# Patient Record
Sex: Female | Born: 1995 | Hispanic: Yes | Marital: Single | Smoking: Former smoker
Health system: Southern US, Community
[De-identification: ages and names within clinical notes are randomized; demographics above are authoritative.]

## PROBLEM LIST (undated history)

## (undated) DIAGNOSIS — B9689 Other specified bacterial agents as the cause of diseases classified elsewhere: Secondary | ICD-10-CM

## (undated) DIAGNOSIS — N760 Acute vaginitis: Secondary | ICD-10-CM

## (undated) DIAGNOSIS — A749 Chlamydial infection, unspecified: Principal | ICD-10-CM

## (undated) DIAGNOSIS — N898 Other specified noninflammatory disorders of vagina: Secondary | ICD-10-CM

## (undated) DIAGNOSIS — A549 Gonococcal infection, unspecified: Secondary | ICD-10-CM

## (undated) DIAGNOSIS — Z309 Encounter for contraceptive management, unspecified: Secondary | ICD-10-CM

## (undated) HISTORY — DX: Other specified bacterial agents as the cause of diseases classified elsewhere: B96.89

## (undated) HISTORY — DX: Other specified noninflammatory disorders of vagina: N89.8

## (undated) HISTORY — DX: Chlamydial infection, unspecified: A74.9

## (undated) HISTORY — DX: Gonococcal infection, unspecified: A54.9

## (undated) HISTORY — DX: Encounter for contraceptive management, unspecified: Z30.9

## (undated) HISTORY — DX: Acute vaginitis: N76.0

---
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
## 2014-07-28 ENCOUNTER — Encounter: Payer: Self-pay | Admitting: Adult Health

## 2014-07-28 ENCOUNTER — Ambulatory Visit (INDEPENDENT_AMBULATORY_CARE_PROVIDER_SITE_OTHER): Payer: Medicaid Other | Admitting: Adult Health

## 2014-07-28 VITALS — BP 88/52 | Ht 63.0 in | Wt 144.5 lb

## 2014-07-28 DIAGNOSIS — Z309 Encounter for contraceptive management, unspecified: Secondary | ICD-10-CM | POA: Insufficient documentation

## 2014-07-28 DIAGNOSIS — B9689 Other specified bacterial agents as the cause of diseases classified elsewhere: Secondary | ICD-10-CM

## 2014-07-28 DIAGNOSIS — Z30011 Encounter for initial prescription of contraceptive pills: Secondary | ICD-10-CM

## 2014-07-28 DIAGNOSIS — Z3202 Encounter for pregnancy test, result negative: Secondary | ICD-10-CM

## 2014-07-28 DIAGNOSIS — N760 Acute vaginitis: Secondary | ICD-10-CM

## 2014-07-28 DIAGNOSIS — N898 Other specified noninflammatory disorders of vagina: Secondary | ICD-10-CM | POA: Insufficient documentation

## 2014-07-28 DIAGNOSIS — N926 Irregular menstruation, unspecified: Secondary | ICD-10-CM

## 2014-07-28 DIAGNOSIS — A499 Bacterial infection, unspecified: Secondary | ICD-10-CM

## 2014-07-28 HISTORY — DX: Encounter for contraceptive management, unspecified: Z30.9

## 2014-07-28 HISTORY — DX: Other specified noninflammatory disorders of vagina: N89.8

## 2014-07-28 HISTORY — DX: Other specified bacterial agents as the cause of diseases classified elsewhere: B96.89

## 2014-07-28 LAB — POCT WET PREP (WET MOUNT): WBC WET PREP: POSITIVE

## 2014-07-28 LAB — COMPREHENSIVE METABOLIC PANEL
ALK PHOS: 74 U/L (ref 39–117)
AST: 11 U/L (ref 0–37)
Albumin: 4.2 g/dL (ref 3.5–5.2)
BILIRUBIN TOTAL: 0.3 mg/dL (ref 0.2–1.1)
BUN: 12 mg/dL (ref 6–23)
CO2: 26 meq/L (ref 19–32)
CREATININE: 0.7 mg/dL (ref 0.50–1.10)
Calcium: 8.8 mg/dL (ref 8.4–10.5)
Chloride: 103 mEq/L (ref 96–112)
GLUCOSE: 82 mg/dL (ref 70–99)
Potassium: 4.3 mEq/L (ref 3.5–5.3)
Sodium: 137 mEq/L (ref 135–145)
Total Protein: 7.1 g/dL (ref 6.0–8.3)

## 2014-07-28 LAB — CBC
HEMATOCRIT: 36.3 % (ref 36.0–46.0)
Hemoglobin: 12.3 g/dL (ref 12.0–15.0)
MCH: 25.7 pg — ABNORMAL LOW (ref 26.0–34.0)
MCHC: 33.9 g/dL (ref 30.0–36.0)
MCV: 75.9 fL — ABNORMAL LOW (ref 78.0–100.0)
MPV: 10.1 fL (ref 9.4–12.4)
Platelets: 265 10*3/uL (ref 150–400)
RBC: 4.78 MIL/uL (ref 3.87–5.11)
RDW: 15.2 % (ref 11.5–15.5)
WBC: 6.3 10*3/uL (ref 4.0–10.5)

## 2014-07-28 LAB — POCT URINE PREGNANCY: PREG TEST UR: NEGATIVE

## 2014-07-28 MED ORDER — METRONIDAZOLE 500 MG PO TABS: 500.0000 mg | ORAL_TABLET | Freq: Two times a day (BID) | ORAL | Status: DC

## 2014-07-28 MED ORDER — NORETHIN ACE-ETH ESTRAD-FE 1-20 MG-MCG(24) PO CHEW: 1.0000 | CHEWABLE_TABLET | Freq: Every day | ORAL | Status: DC

## 2014-07-28 NOTE — Progress Notes (Signed)
Subjective:     Patient ID: Caitlyn Chen, female   DOB: 13-Feb-1996, 18 y.o.   MRN: [id number]  HPI Caitlyn Chen is a 18 year old white female in complaining of irregular periods, sometimes 2 a month and then may skip.She started in 6th grade about age [age].Uses pads, last sex months ago, does use condoms.She has lost some weight and feels tired at times. She is a new pt to this practice.  Review of Systems See HPI Reviewed past medical,surgical, social and family history. Reviewed medications and allergies.     Objective:   Physical Exam BP 88/52 mmHg  Ht 5\' 3"  (1.6 m)  Wt 144 lb 8 oz (65.545 kg)  BMI 25.60 kg/m2  LMP 07/13/2014   UPT negative, Skin warm and dry.Pelvic: external genitalia is normal in appearance, vagina: white frothy discharge with odor, cervix:smooth, uterus: normal size, shape and contour, non tender, no masses felt, adnexa: no masses or tenderness noted. Wet prep: + for clue cells and +WBCs. GC/CHL obtained.   Assessment:     Irregular periods Vaginal discharge BV  Contraceptive management    Plan:    Rx flagyl 500 mg 1 bid x 7 days, no alcohol, review handout on BV   Review handout on OC use Rx Minastrin disp 1 pack take 1 daily with 11 refills, start with next period, use condoms Check CBC,CMP,TSH and GC.CHL Return in 3 months for ROS

## 2014-07-28 NOTE — Patient Instructions (Addendum)
Oral Contraception Use Oral contraceptive pills (OCPs) are medicines taken to prevent pregnancy. OCPs work by preventing the ovaries from releasing eggs. The hormones in OCPs also cause the cervical mucus to thicken, preventing the sperm from entering the uterus. The hormones also cause the uterine lining to become thin, not allowing a fertilized egg to attach to the inside of the uterus. OCPs are highly effective when taken exactly as prescribed. However, OCPs do not prevent sexually transmitted diseases (STDs). Safe sex practices, such as using condoms along with an OCP, can help prevent STDs. Before taking OCPs, you may have a physical exam and Pap test. Your health care provider may also order blood tests if necessary. Your health care provider will make sure you are a good candidate for oral contraception. Discuss with your health care provider the possible side effects of the OCP you may be prescribed. When starting an OCP, it can take 2 to 3 months for the body to adjust to the changes in hormone levels in your body.  HOW TO TAKE ORAL CONTRACEPTIVE PILLS Your health care provider may advise you on how to start taking the first cycle of OCPs. Otherwise, you can:   Start on day 1 of your menstrual period. You will not need any backup contraceptive protection with this start time.   Start on the first Sunday after your menstrual period or the day you get your prescription. In these cases, you will need to use backup contraceptive protection for the first week.   Start the pill at any time of your cycle. If you take the pill within 5 days of the start of your period, you are protected against pregnancy right away. In this case, you will not need a backup form of birth control. If you start at any other time of your menstrual cycle, you will need to use another form of birth control for 7 days. If your OCP is the type called a minipill, it will protect you from pregnancy after taking it for 2 days (48  hours). After you have started taking OCPs:   If you forget to take 1 pill, take it as soon as you remember. Take the next pill at the regular time.   If you miss 2 or more pills, call your health care provider because different pills have different instructions for missed doses. Use backup birth control until your next menstrual period starts.   If you use a 28-day pack that contains inactive pills and you miss 1 of the last 7 pills (pills with no hormones), it will not matter. Throw away the rest of the non-hormone pills and start a new pill pack.  No matter which day you start the OCP, you will always start a new pack on that same day of the week. Have an extra pack of OCPs and a backup contraceptive method available in case you miss some pills or lose your OCP pack.  HOME CARE INSTRUCTIONS   Do not smoke.   Always use a condom to protect against STDs. OCPs do not protect against STDs.   Use a calendar to mark your menstrual period days.   Read the information and directions that came with your OCP. Talk to your health care provider if you have questions.  SEEK MEDICAL CARE IF:   You develop nausea and vomiting.   You have abnormal vaginal discharge or bleeding.   You develop a rash.   You miss your menstrual period.   You are losing   your hair.   You need treatment for mood swings or depression.   You get dizzy when taking the OCP.   You develop acne from taking the OCP.   You become pregnant.  SEEK IMMEDIATE MEDICAL CARE IF:   You develop chest pain.   You develop shortness of breath.   You have an uncontrolled or severe headache.   You develop numbness or slurred speech.   You develop visual problems.   You develop pain, redness, and swelling in the legs.  Document Released: 07/19/2011 Document Revised: 12/14/2013 Document Reviewed: 01/18/2013 [facility] Patient Information 2015 [place], [place]. This information is not intended to replace  advice given to you by your health care provider. Make sure you discuss any questions you have with your health care provider. Bacterial Vaginosis Bacterial vaginosis is a vaginal infection that occurs when the normal balance of bacteria in the vagina is disrupted. It results from an overgrowth of certain bacteria. This is the most common vaginal infection in women of childbearing age. Treatment is important to prevent complications, especially in pregnant women, as it can cause a premature delivery. CAUSES  Bacterial vaginosis is caused by an increase in harmful bacteria that are normally present in smaller amounts in the vagina. Several different kinds of bacteria can cause bacterial vaginosis. However, the reason that the condition develops is not fully understood. RISK FACTORS Certain activities or behaviors can put you at an increased risk of developing bacterial vaginosis, including:  Having a new sex partner or multiple sex partners.  Douching.  Using an intrauterine device (IUD) for contraception. Women do not get bacterial vaginosis from toilet seats, bedding, swimming pools, or contact with objects around them. SIGNS AND SYMPTOMS  Some women with bacterial vaginosis have no signs or symptoms. Common symptoms include:  Grey vaginal discharge.  A fishlike odor with discharge, especially after sexual intercourse.  Itching or burning of the vagina and vulva.  Burning or pain with urination. DIAGNOSIS  Your health care provider will take a medical history and examine the vagina for signs of bacterial vaginosis. A sample of vaginal fluid may be taken. Your health care provider will look at this sample under a microscope to check for bacteria and abnormal cells. A vaginal pH test may also be done.  TREATMENT  Bacterial vaginosis may be treated with antibiotic medicines. These may be given in the form of a pill or a vaginal cream. A second round of antibiotics may be prescribed if the  condition comes back after treatment.  HOME CARE INSTRUCTIONS   Only take over-the-counter or prescription medicines as directed by your health care provider.  If antibiotic medicine was prescribed, take it as directed. Make sure you finish it even if you start to feel better.  Do not have sex until treatment is completed.  Tell all sexual partners that you have a vaginal infection. They should see their health care provider and be treated if they have problems, such as a mild rash or itching.  Practice safe sex by using condoms and only having one sex partner. SEEK MEDICAL CARE IF:   Your symptoms are not improving after 3 days of treatment.  You have increased discharge or pain.  You have a fever. MAKE SURE YOU:   Understand these instructions.  Will watch your condition.  Will get help right away if you are not doing well or get worse. FOR MORE INFORMATION  Centers for Disease Control and Prevention, Division of STD Prevention: [employer] American Sexual  Health Association (ASHA): www.ashastd.org  Document Released: 07/30/2005 Document Revised: 05/20/2013 Document Reviewed: 03/11/2013 [facility] Patient Information 2015 [place], [place]. This information is not intended to replace advice given to you by your health care provider. Make sure you discuss any questions you have with your health care provider. No alcohol start OCs with next period Use condoms Return in 3 months for follow up

## 2014-07-29 ENCOUNTER — Telehealth: Payer: Self-pay | Admitting: Adult Health

## 2014-07-29 LAB — TSH: TSH: 0.619 u[IU]/mL (ref 0.350–4.500)

## 2014-07-29 LAB — GC/CHLAMYDIA PROBE AMP
CT Probe RNA: POSITIVE — AB
GC Probe RNA: POSITIVE — AB

## 2014-07-29 NOTE — Telephone Encounter (Signed)
Left message to call.

## 2014-07-29 NOTE — Telephone Encounter (Signed)
Let message to call 

## 2014-07-30 ENCOUNTER — Telehealth: Payer: Self-pay | Admitting: Adult Health

## 2014-07-30 NOTE — Telephone Encounter (Signed)
Left message to call.

## 2014-08-02 ENCOUNTER — Telehealth: Payer: Self-pay | Admitting: Adult Health

## 2014-08-02 MED ORDER — AZITHROMYCIN 500 MG PO TABS: ORAL_TABLET | ORAL | Status: DC

## 2014-08-02 NOTE — Telephone Encounter (Signed)
Pt aware of +GC and +chlamydia will Rx azithromycin 500 mg #2 2 po now and come in 12/23 at 11:45 for Rocephin and see me, will send [facility] 12/23

## 2014-08-04 ENCOUNTER — Ambulatory Visit: Payer: Medicaid Other | Admitting: Adult Health

## 2014-08-06 ENCOUNTER — Emergency Department (HOSPITAL_COMMUNITY)
Admission: EM | Admit: 2014-08-06 | Discharge: 2014-08-06 | Disposition: A | Discharge status: Home / Self Care | Payer: Medicaid Other | Attending: Emergency Medicine | Admitting: Emergency Medicine

## 2014-08-06 ENCOUNTER — Encounter (HOSPITAL_COMMUNITY): Payer: Self-pay | Admitting: *Deleted

## 2014-08-06 DIAGNOSIS — S0121XA Laceration without foreign body of nose, initial encounter: Secondary | ICD-10-CM | POA: Diagnosis not present

## 2014-08-06 DIAGNOSIS — Z8742 Personal history of other diseases of the female genital tract: Secondary | ICD-10-CM | POA: Insufficient documentation

## 2014-08-06 DIAGNOSIS — Y9389 Activity, other specified: Secondary | ICD-10-CM | POA: Insufficient documentation

## 2014-08-06 DIAGNOSIS — Y9289 Other specified places as the place of occurrence of the external cause: Secondary | ICD-10-CM | POA: Insufficient documentation

## 2014-08-06 DIAGNOSIS — Z792 Long term (current) use of antibiotics: Secondary | ICD-10-CM | POA: Insufficient documentation

## 2014-08-06 DIAGNOSIS — S80211A Abrasion, right knee, initial encounter: Secondary | ICD-10-CM | POA: Diagnosis not present

## 2014-08-06 DIAGNOSIS — Y998 Other external cause status: Secondary | ICD-10-CM | POA: Insufficient documentation

## 2014-08-06 DIAGNOSIS — Z79899 Other long term (current) drug therapy: Secondary | ICD-10-CM | POA: Insufficient documentation

## 2014-08-06 DIAGNOSIS — S0181XA Laceration without foreign body of other part of head, initial encounter: Secondary | ICD-10-CM

## 2014-08-06 MED ORDER — SODIUM BICARBONATE 4 % IV SOLN
5.0000 mL | Freq: Once | INTRAVENOUS | Status: AC
  Administered 2014-08-06: 5 mL via SUBCUTANEOUS
  Filled 2014-08-06: qty 5

## 2014-08-06 MED ORDER — LIDOCAINE HCL (PF) 1 % IJ SOLN
5.0000 mL | Freq: Once | INTRAMUSCULAR | Status: AC
  Administered 2014-08-06: 5 mL
  Filled 2014-08-06: qty 5

## 2014-08-06 MED ORDER — BACITRACIN ZINC 500 UNIT/GM EX OINT
TOPICAL_OINTMENT | CUTANEOUS | Status: AC
  Filled 2014-08-06: qty 0.9

## 2014-08-06 NOTE — ED Notes (Addendum)
Denies LOC, mother states she has had all shots for school done, unknown of last tetanus shot, lac to nose noted

## 2014-08-06 NOTE — Discharge Instructions (Signed)
Facial Laceration  A facial laceration is a cut on the face. These injuries can be painful and cause bleeding. Lacerations usually heal quickly, but they need special care to reduce scarring. DIAGNOSIS  Your health care provider will take a medical history, ask for details about how the injury occurred, and examine the wound to determine how deep the cut is. TREATMENT  Some facial lacerations may not require closure. Others may not be able to be closed because of an increased risk of infection. The risk of infection and the chance for successful closure will depend on various factors, including the amount of time since the injury occurred. The wound may be cleaned to help prevent infection. If closure is appropriate, pain medicines may be given if needed. Your health care provider will use stitches (sutures), wound glue (adhesive), or skin adhesive strips to repair the laceration. These tools bring the skin edges together to allow for faster healing and a better cosmetic outcome. If needed, you may also be given a tetanus shot. HOME CARE INSTRUCTIONS  Only take over-the-counter or prescription medicines as directed by your health care provider.  Follow your health care provider's instructions for wound care. These instructions will vary depending on the technique used for closing the wound. For Sutures:  Keep the wound clean and dry.   If you were given a bandage (dressing), you should change it at least once a day. Also change the dressing if it becomes wet or dirty, or as directed by your health care provider.   Wash the wound with soap and water 2 times a day. Rinse the wound off with water to remove all soap. Pat the wound dry with a clean towel.   After cleaning, apply a thin layer of the antibiotic ointment recommended by your health care provider. This will help prevent infection and keep the dressing from sticking.   You may shower as usual after the first 24 hours. Do not soak the  wound in water until the sutures are removed.   Get your sutures removed as directed by your health care provider. With facial lacerations, sutures should usually be taken out after 4-5 days to avoid stitch marks.   Wait a few days after your sutures are removed before applying any makeup. For Skin Adhesive Strips:  Keep the wound clean and dry.   Do not get the skin adhesive strips wet. You may bathe carefully, using caution to keep the wound dry.   If the wound gets wet, pat it dry with a clean towel.   Skin adhesive strips will fall off on their own. You may trim the strips as the wound heals. Do not remove skin adhesive strips that are still stuck to the wound. They will fall off in time.  For Wound Adhesive:  You may briefly wet your wound in the shower or bath. Do not soak or scrub the wound. Do not swim. Avoid periods of heavy sweating until the skin adhesive has fallen off on its own. After showering or bathing, gently pat the wound dry with a clean towel.   Do not apply liquid medicine, cream medicine, ointment medicine, or makeup to your wound while the skin adhesive is in place. This may loosen the film before your wound is healed.   If a dressing is placed over the wound, be careful not to apply tape directly over the skin adhesive. This may cause the adhesive to be pulled off before the wound is healed.   Avoid   prolonged exposure to sunlight or tanning lamps while the skin adhesive is in place.  The skin adhesive will usually remain in place for 5-10 days, then naturally fall off the skin. Do not pick at the adhesive film.  After Healing: Once the wound has healed, cover the wound with sunscreen during the day for 1 full year. This can help minimize scarring. Exposure to ultraviolet light in the first year will darken the scar. It can take 1-2 years for the scar to lose its redness and to heal completely.  SEEK IMMEDIATE MEDICAL CARE IF:  You have redness, pain, or  swelling around the wound.   You see ayellowish-white fluid (pus) coming from the wound.   You have chills or a fever.  MAKE SURE YOU:  Understand these instructions.  Will watch your condition.  Will get help right away if you are not doing well or get worse. Document Released: 09/06/2004 Document Revised: 05/20/2013 Document Reviewed: 03/12/2013 [employer] Patient Information 2015 [employer]. This information is not intended to replace advice given to you by your health care provider. Make sure you discuss any questions you have with your health care provider.  
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
## 2014-08-10 ENCOUNTER — Telehealth: Payer: Self-pay | Admitting: Adult Health

## 2014-08-10 NOTE — Telephone Encounter (Signed)
Left message to call me, pt no showed for rocephin 12/23 for +GC, will send certified letter now.

## 2014-08-11 ENCOUNTER — Encounter (HOSPITAL_COMMUNITY): Payer: Self-pay | Admitting: Emergency Medicine

## 2014-08-11 ENCOUNTER — Emergency Department (HOSPITAL_COMMUNITY)
Admission: EM | Admit: 2014-08-11 | Discharge: 2014-08-11 | Disposition: A | Discharge status: Home / Self Care | Payer: Medicaid Other | Attending: Emergency Medicine | Admitting: Emergency Medicine

## 2014-08-11 DIAGNOSIS — Z4802 Encounter for removal of sutures: Secondary | ICD-10-CM

## 2014-08-11 DIAGNOSIS — Z79899 Other long term (current) drug therapy: Secondary | ICD-10-CM | POA: Insufficient documentation

## 2014-08-11 DIAGNOSIS — Z8742 Personal history of other diseases of the female genital tract: Secondary | ICD-10-CM | POA: Insufficient documentation

## 2014-08-11 NOTE — Discharge Instructions (Signed)

## 2014-08-11 NOTE — ED Notes (Signed)
Here for suture removal. From nose

## 2014-08-11 NOTE — ED Provider Notes (Signed)
CSN: [id number]     Arrival date & time 08/11/14  1825 History   First MD Initiated Contact with Patient 08/11/14 1930     Chief Complaint  Patient presents with  . Suture / Staple Removal     (Consider location/radiation/quality/duration/timing/severity/associated sxs/prior Treatment) Patient is a 18 y.o. female presenting with suture removal. The history is provided by the patient.  Suture / Staple Removal This is a new problem. The current episode started in the past 7 days. The problem has been gradually improving. Pertinent negatives include no abdominal pain, arthralgias, chest pain, coughing, fever, nausea, neck pain or numbness. Nothing aggravates the symptoms. She has tried nothing for the symptoms. The treatment provided significant relief.    Past Medical History  Diagnosis Date  . Vaginal discharge 07/28/2014  . BV (bacterial vaginosis) 07/28/2014  . Contraceptive management 07/28/2014   History reviewed. No pertinent past surgical history. Family History  Problem Relation Age of Onset  . Stroke Mother     at age 61  . Hypertension Father   . Asthma Brother   . Other Maternal Grandmother     degenerative spine disease  . Asthma Maternal Grandmother   . Obesity Maternal Grandmother   . Hypertension Maternal Grandmother   . Other Maternal Grandfather     open heart surgery  . Hypertension Maternal Grandfather   . Sickle cell trait Sister   . Asthma Brother    History  Substance Use Topics  . Smoking status: Never Smoker   . Smokeless tobacco: Never Used  . Alcohol Use: No   OB History    Gravida Para Term Preterm AB TAB SAB Ectopic Multiple Living   0 0 0 0 0 0 0 0 0 0      Review of Systems  Constitutional: Negative for fever and activity change.       All ROS Neg except as noted in HPI  HENT: Negative.   Eyes: Negative for photophobia and discharge.  Respiratory: Negative for cough, shortness of breath and wheezing.   Cardiovascular: Negative for  chest pain and palpitations.  Gastrointestinal: Negative for nausea, abdominal pain and blood in stool.  Genitourinary: Negative for dysuria, frequency and hematuria.  Musculoskeletal: Negative for back pain, arthralgias and neck pain.  Skin: Negative.   Neurological: Negative for dizziness, seizures, speech difficulty and numbness.  Psychiatric/Behavioral: Negative for hallucinations and confusion.      Allergies  Review of patient's allergies indicates no known allergies.  Home Medications   Prior to Admission medications   Medication Sig Start Date End Date Taking? Authorizing Provider  Norethin Ace-Eth Estrad-FE (MINASTRIN 24 FE) 1-20 MG-MCG(24) CHEW Chew 1 tablet by mouth daily. 07/28/14  Yes Estill Dooms, NP  azithromycin (ZITHROMAX) 500 MG tablet Take 2 po now Patient not taking: Reported on 08/11/2014 08/02/14   Estill Dooms, NP  metroNIDAZOLE (FLAGYL) 500 MG tablet Take 1 tablet (500 mg total) by mouth 2 (two) times daily. Patient not taking: Reported on 08/11/2014 07/28/14   Estill Dooms, NP   BP 105/65 mmHg  Pulse 95  Temp(Src) 98.4 F (36.9 C) (Oral)  Resp 24  Ht 5\' 3"  (1.6 m)  Wt 142 lb (64.411 kg)  BMI 25.16 kg/m2  SpO2 100%  LMP 07/13/2014 Physical Exam  Constitutional: She is oriented to person, place, and time. She appears well-developed and well-nourished.  Non-toxic appearance.  HENT:  Head: Normocephalic.    Right Ear: Tympanic membrane and external ear normal.  Left Ear:  Tympanic membrane and external ear normal.  Eyes: EOM and lids are normal. Pupils are equal, round, and reactive to light.  Neck: Normal range of motion. Neck supple. Carotid bruit is not present.  Cardiovascular: Normal rate, regular rhythm, normal heart sounds, intact distal pulses and normal pulses.   Pulmonary/Chest: Breath sounds normal. No respiratory distress.  Abdominal: Soft. Bowel sounds are normal. There is no tenderness. There is no guarding.    Musculoskeletal: Normal range of motion.  Lymphadenopathy:       Head (right side): No submandibular adenopathy present.       Head (left side): No submandibular adenopathy present.    She has no cervical adenopathy.  Neurological: She is alert and oriented to person, place, and time. She has normal strength. No cranial nerve deficit or sensory deficit.  Skin: Skin is warm and dry.  Psychiatric: She has a normal mood and affect. Her speech is normal.  Nursing note and vitals reviewed.   ED Course  Procedures (including critical care time) Labs Review Labs Reviewed - No data to display  Imaging Review No results found.   EKG Interpretation None      MDM  The sutured wound is healing nicely. There are 2 sutures remaining in the wound area. There is no evidence of any abscess or infection present. Sutures removed by nursing staff. Patient is advised to return if any changes, problems, or concerns.    Final diagnoses:  None    *I have reviewed nursing notes, vital signs, and all appropriate lab and imaging results for this patient.[address], PA-C 08/11/14 1956  Nat Christen, MD 08/12/14 [phone]

## 2014-08-11 NOTE — ED Notes (Signed)
Patient here to have stitches removed from bridge of nose.

## 2014-08-31 ENCOUNTER — Telehealth: Payer: Self-pay | Admitting: Adult Health

## 2014-08-31 NOTE — Telephone Encounter (Signed)
Left message to call.

## 2014-09-02 ENCOUNTER — Ambulatory Visit (INDEPENDENT_AMBULATORY_CARE_PROVIDER_SITE_OTHER): Payer: Medicaid Other | Admitting: Adult Health

## 2014-09-02 ENCOUNTER — Encounter: Payer: Self-pay | Admitting: Adult Health

## 2014-09-02 VITALS — BP 120/70 | Ht 63.0 in | Wt 143.5 lb

## 2014-09-02 DIAGNOSIS — Z113 Encounter for screening for infections with a predominantly sexual mode of transmission: Secondary | ICD-10-CM

## 2014-09-02 DIAGNOSIS — A749 Chlamydial infection, unspecified: Secondary | ICD-10-CM

## 2014-09-02 DIAGNOSIS — A549 Gonococcal infection, unspecified: Secondary | ICD-10-CM

## 2014-09-02 HISTORY — DX: Chlamydial infection, unspecified: A74.9

## 2014-09-02 HISTORY — DX: Gonococcal infection, unspecified: A54.9

## 2014-09-02 MED ORDER — AZITHROMYCIN 500 MG PO TABS: ORAL_TABLET | ORAL | Status: DC

## 2014-09-02 NOTE — Patient Instructions (Signed)
NO sex til after proof of treatment Return in 3 weeks for proof of treatment Gonorrhea Gonorrhea is an infection that can cause serious problems. If left untreated, the infection may:   Damage the female or female organs.   Cause women to be unable to have children (sterility).   Harm a fetus if the infected woman is pregnant.  It is important to get treatment for gonorrhea as soon as possible. It is also necessary that all your sexual partners be tested for the infection.  CAUSES  Gonorrhea is caused by bacteria called Neisseria gonorrhoeae. The infection is spread from person to person, usually by sexual contact (such as by anal, vaginal, or oral means). A newborn can contract the infection from his or her mother during birth.  SYMPTOMS  Some people with gonorrhea do not have symptoms. Symptoms may be different in females and males.  Females The most common symptoms are:   Pain in the lower abdomen.   Fever with or without chills.  Other symptoms include:   Abnormal vaginal discharge.   Painful intercourse.   Burning or itching of the vagina or lips of the vagina.   Abnormal vaginal bleeding.   Pain when urinating.   Long-lasting (chronic) pain in the lower abdomen, especially during menstruation or intercourse.   Inability to become pregnant.   Going into premature labor.   Irritation, pain, bleeding, or discharge from the rectum. This may occur if the infection was spread by anal sex.   Sore throat or swollen lymph nodes in the neck. This may occur if the infection was spread by oral sex.  Males The most common symptoms are:   Discharge from the penis.   Pain or burning during urination.   Pain or swelling in the testicles. Other symptoms may include:   Irritation, pain, bleeding, or discharge from the rectum. This may occur if the infection was spread by anal sex.   Sore throat, fever, or swollen lymph nodes in the neck. This may occur if  the infection was spread by oral sex.  DIAGNOSIS  A diagnosis is made after a physical exam is done and a sample of discharge is examined under a microscope for the presence of the bacteria. The discharge may be taken from the urethra, cervix, throat, or rectum.  TREATMENT  Gonorrhea is treated with antibiotic medicines. It is important for treatment to begin as soon as possible. Early treatment may prevent some problems from developing.  HOME CARE INSTRUCTIONS   Take medicines only as directed by your health care provider.   Take your antibiotic medicine as directed by your health care provider. Finish the antibiotic even if you start to feel better. Incomplete treatment will put you at risk for continued infection.   Do not have sex until treatment is complete or as directed by your health care provider.   Keep all follow-up visits as directed by your health care provider.   Not all test results are available during your visit. If your test results are not back during the visit, make an appointment with your health care provider to find out the results. Do not assume everything is normal if you have not heard from your health care provider or the medical facility. It is your responsibility to get your test results.  If you test positive for gonorrhea, inform your recent sexual partners. They need to be checked for gonorrhea even if they do not have symptoms. They may need treatment, even if they test  negative for gonorrhea.  SEEK MEDICAL CARE IF:   You develop any bad reaction to the medicine you were prescribed. This may include:   A rash.   Nausea.   Vomiting.   Diarrhea.   Your symptoms do not improve after a few days of taking antibiotics.   Your symptoms get worse.   You develop increased pain, such as in the testicles (for males) or in the abdomen (for females).  You have a fever. MAKE SURE YOU:   Understand these instructions.  Will watch your  condition.  Will get help right away if you are not doing well or get worse. Document Released: 07/27/2000 Document Revised: 12/14/2013 Document Reviewed: 02/04/2013 [facility] Patient Information 2015 [place], [place]. This information is not intended to replace advice given to you by your health care provider. Make sure you discuss any questions you have with your health care provider. Chlamydia Chlamydia is an infection. It is spread through sexual contact. Chlamydia can be in different areas of the body. These areas include the cervix, urethra, throat, or rectum. You may not know you have chlamydia because many people never develop the symptoms. Chlamydia is not difficult to treat once you know you have it. However, if it is left untreated, chlamydia can lead to more serious health problems.  CAUSES  Chlamydia is caused by bacteria. It is a sexually transmitted disease. It is passed from an infected partner during intimate contact. This contact could be with the genitals, mouth, or rectal area. Chlamydia can also be passed from mothers to babies during birth. SIGNS AND SYMPTOMS  There may not be any symptoms. This is often the case early in the infection. If symptoms develop, they may include:  Mild pain and discomfort when urinating.  Redness, soreness, and swelling (inflammation) of the rectum.  Vaginal discharge.  Painful intercourse.  Abdominal pain.  Bleeding between menstrual periods. DIAGNOSIS  To diagnose this infection, your health care provider will do a pelvic exam. Cultures will be taken of the vagina, cervix, urine, and possibly the rectum to verify the diagnosis.  TREATMENT You will be given antibiotic medicines. If you are pregnant, certain types of antibiotics will need to be avoided. Any sexual partners should also be treated, even if they do not show symptoms.  HOME CARE INSTRUCTIONS   Take your antibiotic medicine as directed by your health care provider. Finish the  antibiotic even if you start to feel better.  Take medicines only as directed by your health care provider.  Inform any sexual partners about the infection. They should also be treated.  Do not have sexual contact until your health care provider tells you it is okay.  Get plenty of rest.  Eat a well-balanced diet.  Drink enough fluids to keep your urine clear or pale yellow.  Keep all follow-up visits as directed by your health care provider. SEEK MEDICAL CARE IF:  You have painful urination.  You have abdominal pain.  You have vaginal discharge.  You have painful sexual intercourse.  You have bleeding between periods and after sex.  You have a fever. SEEK IMMEDIATE MEDICAL CARE IF:   You experience nausea or vomiting.  You experience excessive sweating (diaphoresis).  You have difficulty swallowing. MAKE SURE YOU:   Understand these instructions.  Will watch your condition.  Will get help right away if you are not doing well or get worse. Document Released: 05/09/2005 Document Revised: 12/14/2013 Document Reviewed: 04/06/2013 [facility] Patient Information 2015 [place], [place]. This information  is not intended to replace advice given to you by your health care provider. Make sure you discuss any questions you have with your health care provider.  

## 2014-09-02 NOTE — Progress Notes (Signed)
Subjective:     Patient ID: Suzie Portela, female   DOB: 12-05-1995, 19 y.o.   MRN: [id number]  HPI Revecca is a 19 year old white female in for treatment for gonorrhea and chlamydia, she was diagnosed 07/28/14.Has some nausea, no vomiting or pain.Has told partner to go to clinic for treatment.  Review of Systems See HPI Reviewed past medical,surgical, social and family history. Reviewed medications and allergies.     Objective:   Physical Exam BP 120/70 mmHg  Ht 5\' 3"  (1.6 m)  Wt 143 lb 8 oz (65.091 kg)  BMI 25.43 kg/m2  LMP 08/13/2014   Will give rocephin 250 mg IM in office and Rx sent again to [facility] for azithromycin 500 mg #2 2 po now and will get HIV,RPR and HSV 2 labs.  Assessment:     Gonorrhea  Chlamydia     Plan:    Rx azithromycin 500 mg #2 2 po now Review handout on GC/CHL Check HIV,RPR and HSV2  Return in 3 weeks for proof of treatment [place] sent again to [facility]

## 2014-09-03 LAB — RPR

## 2014-09-03 LAB — HIV ANTIBODY (ROUTINE TESTING W REFLEX): HIV 1&2 Ab, 4th Generation: NONREACTIVE

## 2014-09-06 LAB — HSV 2 ANTIBODY, IGG

## 2014-09-23 ENCOUNTER — Ambulatory Visit (INDEPENDENT_AMBULATORY_CARE_PROVIDER_SITE_OTHER): Payer: Medicaid Other | Admitting: Adult Health

## 2014-09-23 ENCOUNTER — Encounter: Payer: Self-pay | Admitting: Adult Health

## 2014-09-23 VITALS — BP 100/64 | Ht 63.0 in | Wt 145.5 lb

## 2014-09-23 DIAGNOSIS — A549 Gonococcal infection, unspecified: Secondary | ICD-10-CM

## 2014-09-23 NOTE — Progress Notes (Signed)
Subjective:     Patient ID: Caitlyn Chen, female   DOB: 09-10-1995, 19 y.o.   MRN: [id number]  HPI Caitlyn Chen is a 19 year old female back in today for proof of treatment of recent Gonorrhea.She says she took po meds and has not had sex since getting rocephin.Denies any pain, burning or discharge or unusual bleeding.  Review of Systems Patient denies any headaches, hearing loss, fatigue, blurred vision, shortness of breath, chest pain, abdominal pain, problems with bowel movements, urination, or intercourse. No joint pain or mood swings. Reviewed past medical,surgical, social and family history. Reviewed medications and allergies.     Objective:   Physical Exam BP 100/64 mmHg  Ht 5\' 3"  (1.6 m)  Wt 145 lb 8 oz (65.998 kg)  BMI 25.78 kg/m2  LMP 09/17/2014   Skin warm and dry, abdomen soft, non tender, no masses, stressed importance of using condoms.  Assessment:     Proof of treatment Gonorrhea    Plan:     GC/CHL sent Use condoms Follow up prn

## 2014-09-23 NOTE — Patient Instructions (Signed)
Use condoms  

## 2014-09-28 ENCOUNTER — Telehealth: Payer: Self-pay | Admitting: Adult Health

## 2014-09-28 LAB — GC/CHLAMYDIA PROBE AMP
Chlamydia trachomatis, NAA: NEGATIVE
Neisseria gonorrhoeae by PCR: NEGATIVE

## 2014-09-28 NOTE — Telephone Encounter (Signed)
Left message I called 

## 2014-10-27 ENCOUNTER — Encounter: Payer: Self-pay | Admitting: *Deleted

## 2014-10-27 ENCOUNTER — Ambulatory Visit: Payer: Medicaid Other | Admitting: Adult Health

## 2015-03-03 ENCOUNTER — Other Ambulatory Visit (HOSPITAL_COMMUNITY): Payer: Self-pay | Admitting: Family Medicine

## 2015-03-03 DIAGNOSIS — E041 Nontoxic single thyroid nodule: Secondary | ICD-10-CM

## 2015-03-08 ENCOUNTER — Encounter (HOSPITAL_COMMUNITY): Admission: RE | Admit: 2015-03-08 | Payer: Medicaid Other | Source: Ambulatory Visit

## 2015-04-05 ENCOUNTER — Encounter (HOSPITAL_COMMUNITY)
Admission: RE | Admit: 2015-04-05 | Discharge: 2015-04-05 | Disposition: A | Discharge status: Home / Self Care | Payer: Medicaid Other | Source: Ambulatory Visit | Attending: Family Medicine | Admitting: Family Medicine

## 2015-04-05 ENCOUNTER — Encounter (HOSPITAL_COMMUNITY): Payer: Self-pay

## 2015-04-05 DIAGNOSIS — E041 Nontoxic single thyroid nodule: Secondary | ICD-10-CM | POA: Diagnosis present

## 2015-04-05 MED ORDER — SODIUM PERTECHNETATE TC 99M INJECTION
10.0000 | Freq: Once | INTRAVENOUS | Status: AC | PRN
  Administered 2015-04-05: 10.3 via INTRAVENOUS

## 2015-06-17 ENCOUNTER — Ambulatory Visit: Payer: Medicaid Other | Admitting: "Endocrinology

## 2015-07-04 ENCOUNTER — Encounter: Payer: Self-pay | Admitting: "Endocrinology

## 2015-07-04 ENCOUNTER — Ambulatory Visit (INDEPENDENT_AMBULATORY_CARE_PROVIDER_SITE_OTHER): Payer: Medicaid Other | Admitting: "Endocrinology

## 2015-07-04 VITALS — BP 99/67 | HR 75 | Ht 63.0 in | Wt 146.0 lb

## 2015-07-04 DIAGNOSIS — E079 Disorder of thyroid, unspecified: Secondary | ICD-10-CM

## 2015-07-04 DIAGNOSIS — E0510 Thyrotoxicosis with toxic single thyroid nodule without thyrotoxic crisis or storm: Secondary | ICD-10-CM | POA: Insufficient documentation

## 2015-07-04 NOTE — Progress Notes (Signed)
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   Past Medical History  Diagnosis Date  . Vaginal discharge 07/28/2014  . BV (bacterial vaginosis) 07/28/2014  . Contraceptive management 07/28/2014  . Chlamydia infection 09/02/2014  . Gonorrhea 09/02/2014   History reviewed. No pertinent past surgical history. Social History   Social History  . Marital Status: Single    Spouse Name: N/A  . Number of Children: N/A  . Years of Education: N/A   Social History Main Topics  . Smoking status: Never Smoker   . Smokeless tobacco: Never Used  . Alcohol Use: No  . Drug Use: No  . Sexual Activity: Not Currently    Birth Control/ Protection: Pill   Other Topics Concern  . None   Social History Narrative   Outpatient Encounter Prescriptions as of 07/04/2015  Medication Sig  . Norethin Ace-Eth Estrad-FE (MINASTRIN 24 FE) 1-20 MG-MCG(24) CHEW Chew 1 tablet by mouth daily.   No facility-administered encounter medications on file as of 07/04/2015.   ALLERGIES: No Known Allergies VACCINATION STATUS:  There is no immunization history on file for this patient.  HPI  The patient presents today with a medical history as above, and is being seen in consultation for hyperthyroidism requested by  [facility] . Approximately 6 months ago, patient has been dealing with symptoms of  palpitations, anxiety, and fatigue. On 02/23/2015, she underwent thyroid function test. This showed T4 normal at 6.7, TSH suppressed at 0.139, free thyroxine index normal at 1.9, T3 uptake normal at 28%. On subsequent visit in August, she underwent thyroid uptake and scan which showed warm nodule on the right lobe of the thyroid. She was not given any antithyroid therapy. A distant cousin has unidentified thyroid dysfunction. She reports normal menstrual cycle. Denies heat, cold intolerance. She has a steady body weight. Patient  is  willing to proceed with appropriate work up and therapy for thyrotoxicosis.   Review of Systems  Constitutional: - weight gain/loss, + fatigue, no subjective hyperthermia/hypothermia Eyes: no blurry vision, no xerophthalmia ENT: no sore throat, no nodules palpated in throat, no dysphagia/odynophagia, no hoarseness Cardiovascular: no CP/SOB/palpitations/leg swelling Respiratory: no cough/SOB Gastrointestinal: no N/V/D/C Musculoskeletal: no muscle/joint aches Skin: no rashes Neurological: no tremors/numbness/tingling/dizziness Psychiatric: no depression/anxiety  Objective:    BP 99/67 mmHg  Pulse 75  Ht 5\' 3"  (1.6 m)  Wt 146 lb (66.225 kg)  BMI 25.87 kg/m2  SpO2 98%  Wt Readings from Last 3 Encounters:  07/04/15 146 lb (66.225 kg) (76 %*, Z = 0.70)  09/23/14 145 lb 8 oz (65.998 kg) (77 %*, Z = 0.75)  09/02/14 143 lb 8 oz (65.091 kg) (75 %*, Z = 0.69)   * Growth percentiles are based on CDC 2-20 Years data.    Physical Exam   Constitutional: overweight, in NAD Eyes: PERRLA, EOMI, no exophthalmos ENT: moist mucous membranes, palpable nodule on the right thyroid lobe, no cervical lymphadenopathy Cardiovascular: RRR, No MRG Respiratory: CTA B Gastrointestinal: abdomen soft, NT, ND, BS+ Musculoskeletal: no deformities, strength intact in all 4 Skin: moist, warm, no rashes Neurological: no tremor with outstretched hands, DTR normal in all 4   On 02/23/2015, she underwent thyroid function test. This showed T4 normal at 6.7, TSH suppressed at 0.139, free thyroxine index normal at 1.9, T3 uptake normal at 28%. On subsequent visit in August, she underwent thyroid uptake and scan which showed warm nodule  on the right lobe of the thyroid.  Assessment & Plan:   1. Disorder of thyroid gland Patient's history and most recent labs are reviewed. Findings and her clinical evaluation do not suggest thyroid dysfunction at this time. I will proceed with a plan to obtain fresh set of  thyroid function test today. She will return in 1 week for treatment decision if necessary.  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 Toxic nodule was reported. If repeat thyroid function tests are consistent with hyperthyroidism, she will be approached for ablative therapy. If thyroid function tests are normal, she will need repeat thyroid ultrasound in 1 year to monitor nodular growth.   Follow up plan: Return in about 1 week (around 07/11/2015) for overactive thyroid, labs today.  Glade Lloyd, MD Phone: [phone]  Fax: [phone]   07/04/2015, 7:52 PM

## 2015-07-14 ENCOUNTER — Ambulatory Visit: Payer: Medicaid Other | Admitting: "Endocrinology

## 2015-07-16 LAB — THYROID PEROXIDASE ANTIBODY: THYROID PEROXIDASE ANTIBODY: 5 [IU]/mL (ref <9)

## 2015-07-16 LAB — TSH: TSH: 0.521 u[IU]/mL (ref 0.350–4.500)

## 2015-07-16 LAB — THYROGLOBULIN ANTIBODY: THYROGLOBULIN AB: 1 [IU]/mL (ref <2)

## 2015-07-16 LAB — T4, FREE: FREE T4: 0.91 ng/dL (ref 0.80–1.80)

## 2015-08-03 ENCOUNTER — Ambulatory Visit (INDEPENDENT_AMBULATORY_CARE_PROVIDER_SITE_OTHER): Payer: Medicaid Other | Admitting: "Endocrinology

## 2015-08-03 ENCOUNTER — Encounter: Payer: Self-pay | Admitting: "Endocrinology

## 2015-08-03 VITALS — BP 102/64 | HR 77 | Ht 63.0 in | Wt 147.0 lb

## 2015-08-03 DIAGNOSIS — E079 Disorder of thyroid, unspecified: Secondary | ICD-10-CM

## 2015-08-03 DIAGNOSIS — E042 Nontoxic multinodular goiter: Secondary | ICD-10-CM | POA: Diagnosis not present

## 2015-08-03 DIAGNOSIS — E049 Nontoxic goiter, unspecified: Secondary | ICD-10-CM | POA: Insufficient documentation

## 2015-08-04 NOTE — Progress Notes (Signed)
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   Past Medical History  Diagnosis Date  . Vaginal discharge 07/28/2014  . BV (bacterial vaginosis) 07/28/2014  . Contraceptive management 07/28/2014  . Chlamydia infection 09/02/2014  . Gonorrhea 09/02/2014   History reviewed. No pertinent past surgical history. Social History   Social History  . Marital Status: Single    Spouse Name: N/A  . Number of Children: N/A  . Years of Education: N/A   Social History Main Topics  . Smoking status: Never Smoker   . Smokeless tobacco: Never Used  . Alcohol Use: No  . Drug Use: No  . Sexual Activity: Not Currently    Birth Control/ Protection: Pill   Other Topics Concern  . None   Social History Narrative   Outpatient Encounter Prescriptions as of 08/03/2015  Medication Sig  . Norethin Ace-Eth Estrad-FE (MINASTRIN 24 FE) 1-20 MG-MCG(24) CHEW Chew 1 tablet by mouth daily.   No facility-administered encounter medications on file as of 08/03/2015.   ALLERGIES: No Known Allergies VACCINATION STATUS:  There is no immunization history on file for this patient.  HPI  The patient presents today with repeat thyroid function tests for follow-up. -She has no new complaints.  - Approximately 6 months ago, patient has been dealing with symptoms of  palpitations, anxiety, and fatigue. On 02/23/2015, she underwent thyroid function test. This showed T4 normal at 6.7, TSH suppressed at 0.139, free thyroxine index normal at 1.9, T3 uptake normal at 28%. On subsequent visit in August, she underwent thyroid uptake and scan which showed warm nodule on the right lobe of the thyroid. She was not given any antithyroid therapy. A distant cousin has unidentified thyroid dysfunction. She reports normal menstrual cycle. Denies heat, cold intolerance. She has a steady body weight.  Review of Systems  Constitutional: - weight gain/loss, + fatigue, no  subjective hyperthermia/hypothermia Eyes: no blurry vision, no xerophthalmia ENT: no sore throat, no nodules palpated in throat, no dysphagia/odynophagia, no hoarseness Cardiovascular: no CP/SOB/palpitations/leg swelling Respiratory: no cough/SOB Gastrointestinal: no N/V/D/C Musculoskeletal: no muscle/joint aches Skin: no rashes Neurological: no tremors/numbness/tingling/dizziness Psychiatric: no depression/anxiety  Objective:    BP 102/64 mmHg  Pulse 77  Ht 5\' 3"  (1.6 m)  Wt 147 lb (66.679 kg)  BMI 26.05 kg/m2  SpO2 97%  Wt Readings from Last 3 Encounters:  08/03/15 147 lb (66.679 kg)  07/04/15 146 lb (66.225 kg) (76 %*, Z = 0.70)  09/23/14 145 lb 8 oz (65.998 kg) (77 %*, Z = 0.75)   * Growth percentiles are based on CDC 2-20 Years data.    Physical Exam   Constitutional: overweight, in NAD Eyes: PERRLA, EOMI, no exophthalmos ENT: moist mucous membranes, palpable nodule on the right thyroid lobe, no cervical lymphadenopathy Cardiovascular: RRR, No MRG Respiratory: CTA B Gastrointestinal: abdomen soft, NT, ND, BS+ Musculoskeletal: no deformities, strength intact in all 4 Skin: moist, warm, no rashes Neurological: no tremor with outstretched hands, DTR normal in all 4  Recent Results (from the past 2160 hour(s))  Thyroid peroxidase antibody     Status: None   Collection Time: 07/15/15 10:07 AM  Result Value Ref Range   Thyroperoxidase Ab SerPl-aCnc 5 <9 IU/mL  TSH     Status: None   Collection Time: 07/15/15 10:07 AM  Result Value Ref Range   TSH 0.521 0.350 - 4.500 uIU/mL  T4, free     Status: None  Collection Time: 07/15/15 10:07 AM  Result Value Ref Range   Free T4 0.91 0.80 - 1.80 ng/dL  Thyroglobulin antibody     Status: None   Collection Time: 07/15/15 10:07 AM  Result Value Ref Range   Thyroglobulin Ab 1 <2 IU/mL    On 02/23/2015, she underwent thyroid function test. This showed T4 normal at 6.7, TSH suppressed at 0.139, free thyroxine index normal at  1.9, T3 uptake normal at 28%. On subsequent visit in August, she underwent thyroid uptake and scan which showed warm nodule on the right lobe of the thyroid.  Assessment & Plan:   1. 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 Findings and her clinical evaluation do not suggest thyroid dysfunction at this time. -Her repeat thyroid function tests are within normal limits. She does not have antithyroid antibodies. She would not need intervention for this for now. -Given and nodule on the right lobe of her thyroid, she would need dedicated thyroid ultrasound in 6 month for better steady.   - Her prior 24 hour uptake, a Toxic nodule was reported. If repeat thyroid function tests are consistent with hyperthyroidism, she will be approached for ablative therapy preceded by repeat thyroid uptake and scan..  Follow up plan: Return in about 6 months (around 02/01/2016) for Thyroid Ultrasound, follow up with pre-visit labs.  Glade Lloyd, MD Phone: [phone]  Fax: [phone]   08/04/2015, 10:06 AM

## 2015-11-04 ENCOUNTER — Encounter (HOSPITAL_COMMUNITY): Payer: Self-pay | Admitting: Emergency Medicine

## 2015-11-04 ENCOUNTER — Emergency Department (HOSPITAL_COMMUNITY)
Admission: EM | Admit: 2015-11-04 | Discharge: 2015-11-04 | Disposition: A | Discharge status: Home / Self Care | Payer: No Typology Code available for payment source | Attending: Emergency Medicine | Admitting: Emergency Medicine

## 2015-11-04 ENCOUNTER — Emergency Department (HOSPITAL_COMMUNITY): Payer: No Typology Code available for payment source

## 2015-11-04 DIAGNOSIS — S39012A Strain of muscle, fascia and tendon of lower back, initial encounter: Secondary | ICD-10-CM | POA: Insufficient documentation

## 2015-11-04 DIAGNOSIS — Y999 Unspecified external cause status: Secondary | ICD-10-CM | POA: Insufficient documentation

## 2015-11-04 DIAGNOSIS — Y929 Unspecified place or not applicable: Secondary | ICD-10-CM | POA: Insufficient documentation

## 2015-11-04 DIAGNOSIS — Y939 Activity, unspecified: Secondary | ICD-10-CM | POA: Insufficient documentation

## 2015-11-04 LAB — POC URINE PREG, ED: Preg Test, Ur: NEGATIVE

## 2015-11-04 MED ORDER — IBUPROFEN 800 MG PO TABS: 800.0000 mg | ORAL_TABLET | Freq: Three times a day (TID) | ORAL | Status: DC

## 2015-11-04 MED ORDER — CYCLOBENZAPRINE HCL 10 MG PO TABS: 10.0000 mg | ORAL_TABLET | Freq: Three times a day (TID) | ORAL | Status: DC | PRN
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
## 2016-02-02 ENCOUNTER — Ambulatory Visit: Payer: Medicaid Other | Admitting: "Endocrinology

## 2016-05-30 ENCOUNTER — Ambulatory Visit: Payer: Medicaid Other | Admitting: Adult Health

## 2016-06-07 ENCOUNTER — Ambulatory Visit: Payer: Medicaid Other | Admitting: Adult Health

## 2016-06-28 ENCOUNTER — Ambulatory Visit: Payer: Medicaid Other | Admitting: Adult Health

## 2017-11-02 ENCOUNTER — Encounter (HOSPITAL_COMMUNITY): Payer: Self-pay | Admitting: Emergency Medicine

## 2017-11-02 ENCOUNTER — Other Ambulatory Visit: Payer: Self-pay

## 2017-11-02 ENCOUNTER — Emergency Department (HOSPITAL_COMMUNITY): Payer: BLUE CROSS/BLUE SHIELD

## 2017-11-02 ENCOUNTER — Emergency Department (HOSPITAL_COMMUNITY)
Admission: EM | Admit: 2017-11-02 | Discharge: 2017-11-02 | Disposition: A | Discharge status: Home / Self Care | Payer: BLUE CROSS/BLUE SHIELD | Attending: Emergency Medicine | Admitting: Emergency Medicine

## 2017-11-02 DIAGNOSIS — Z79899 Other long term (current) drug therapy: Secondary | ICD-10-CM | POA: Insufficient documentation

## 2017-11-02 DIAGNOSIS — R0781 Pleurodynia: Secondary | ICD-10-CM | POA: Insufficient documentation

## 2017-11-02 DIAGNOSIS — Z87891 Personal history of nicotine dependence: Secondary | ICD-10-CM | POA: Insufficient documentation

## 2017-11-02 LAB — COMPREHENSIVE METABOLIC PANEL
ALBUMIN: 3.7 g/dL (ref 3.5–5.0)
ALT: 6 U/L — ABNORMAL LOW (ref 14–54)
AST: 15 U/L (ref 15–41)
Alkaline Phosphatase: 85 U/L (ref 38–126)
Anion gap: 9 (ref 5–15)
BILIRUBIN TOTAL: 0.5 mg/dL (ref 0.3–1.2)
BUN: 13 mg/dL (ref 6–20)
CHLORIDE: 104 mmol/L (ref 101–111)
CO2: 28 mmol/L (ref 22–32)
Calcium: 9.1 mg/dL (ref 8.9–10.3)
Creatinine, Ser: 0.66 mg/dL (ref 0.44–1.00)
GFR calc Af Amer: >60 mL/min (ref >60)
GFR calc non Af Amer: >60 mL/min (ref >60)
Glucose, Bld: 83 mg/dL (ref 65–99)
Potassium: 3.9 mmol/L (ref 3.5–5.1)
Sodium: 141 mmol/L (ref 135–145)
TOTAL PROTEIN: 8.2 g/dL — AB (ref 6.5–8.1)

## 2017-11-02 LAB — CBC WITH DIFFERENTIAL/PLATELET
Basophils Absolute: 0 10*3/uL (ref 0.0–0.1)
Basophils Relative: 0 %
Eosinophils Absolute: 0.3 10*3/uL (ref 0.0–0.7)
Eosinophils Relative: 4 %
HEMATOCRIT: 37.3 % (ref 36.0–46.0)
Hemoglobin: 11.5 g/dL — ABNORMAL LOW (ref 12.0–15.0)
LYMPHS PCT: 25 %
Lymphs Abs: 2.2 10*3/uL (ref 0.7–4.0)
MCH: 25.3 pg — ABNORMAL LOW (ref 26.0–34.0)
MCHC: 30.8 g/dL (ref 30.0–36.0)
MCV: 82.2 fL (ref 78.0–100.0)
Monocytes Absolute: 0.6 10*3/uL (ref 0.1–1.0)
Monocytes Relative: 6 %
Neutro Abs: 5.7 10*3/uL (ref 1.7–7.7)
Neutrophils Relative %: 65 %
Platelets: 258 10*3/uL (ref 150–400)
RBC: 4.54 MIL/uL (ref 3.87–5.11)
RDW: 13.2 % (ref 11.5–15.5)
WBC: 8.7 10*3/uL (ref 4.0–10.5)

## 2017-11-02 LAB — URINALYSIS, ROUTINE W REFLEX MICROSCOPIC
Bilirubin Urine: NEGATIVE
Glucose, UA: NEGATIVE mg/dL
KETONES UR: NEGATIVE mg/dL
Nitrite: NEGATIVE
Protein, ur: NEGATIVE mg/dL
Specific Gravity, Urine: 1.02 (ref 1.005–1.030)
pH: 5 (ref 5.0–8.0)

## 2017-11-02 LAB — D-DIMER, QUANTITATIVE: D-Dimer, Quant: 1.92 ug/mL-FEU — ABNORMAL HIGH (ref 0.00–0.50)

## 2017-11-02 LAB — POC URINE PREG, ED: PREG TEST UR: NEGATIVE

## 2017-11-02 LAB — LIPASE, BLOOD: Lipase: 37 U/L (ref 11–51)

## 2017-11-02 MED ORDER — HYDROCODONE-ACETAMINOPHEN 5-325 MG PO TABS
1.0000 | ORAL_TABLET | Freq: Once | ORAL | Status: AC
  Administered 2017-11-02: 1 via ORAL
  Filled 2017-11-02: qty 1

## 2017-11-02 MED ORDER — IBUPROFEN 600 MG PO TABS: 600.0000 mg | ORAL_TABLET | Freq: Four times a day (QID) | ORAL | 0 refills | Status: DC | PRN

## 2017-11-02 MED ORDER — IOPAMIDOL (ISOVUE-370) INJECTION 76%
100.0000 mL | Freq: Once | INTRAVENOUS | Status: AC | PRN
  Administered 2017-11-02: 100 mL via INTRAVENOUS

## 2017-11-02 MED ORDER — TRAMADOL HCL 50 MG PO TABS: 50.0000 mg | ORAL_TABLET | Freq: Four times a day (QID) | ORAL | 0 refills | Status: DC | PRN

## 2017-11-02 NOTE — ED Notes (Signed)
Patient transported to X-ray 

## 2017-11-02 NOTE — ED Notes (Signed)
Patient transported to CT 

## 2017-11-02 NOTE — ED Provider Notes (Signed)
[facility] EMERGENCY DEPARTMENT Provider Note   CSN: [id number] Arrival date & time: 11/02/17  1655     History   Chief Complaint Chief Complaint  Patient presents with  . Shoulder Pain    HPI Caitlyn Chen is a 22 y.o. female.  HPI   Caitlyn Chen is a 22 y.o. female who presents to the Emergency Department complaining of right shoulder and right rib pain for 2 days.  She states that she woke up with the pain and has gradually worsened since onset.  She describes the pain as aching and associated with lying on her right side, coughing, laughing or sneezing.  She also states the pain is exacerbated by deep breathing.  She is tried applying topical muscle rub without relief.  She states that she does some lifting at work and she is unsure if she may have injured herself at work.  She denies shortness of breath, abdominal pain, pain with food intake, vomiting or fever.   Past Medical History:  Diagnosis Date  . BV (bacterial vaginosis) 07/28/2014  . Chlamydia infection 09/02/2014  . Contraceptive management 07/28/2014  . Gonorrhea 09/02/2014  . Vaginal discharge 07/28/2014    Patient Active Problem List   Diagnosis Date Noted  . Nodular goiter 08/03/2015  . Disorder of thyroid gland 07/04/2015  . 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 Chlamydia infection 09/02/2014  . Gonorrhea 09/02/2014  . Irregular periods 07/28/2014  . Vaginal discharge 07/28/2014  . BV (bacterial vaginosis) 07/28/2014  . Contraceptive management 07/28/2014    History reviewed. No pertinent surgical history.   OB History    Gravida  0   Para  0   Term  0   Preterm  0   AB  0   Living  0     SAB  0   TAB  0   Ectopic  0   Multiple  0   Live Births               Home Medications    Prior to Admission medications   Medication Sig Start Date End Date Taking? Authorizing Provider  cyclobenzaprine (FLEXERIL) 10 MG tablet Take 1 tablet (10 mg total) by mouth 3 (three) times  daily as needed. 11/04/15   Jenet Durio, PA-C  ibuprofen (ADVIL,MOTRIN) 800 MG tablet Take 1 tablet (800 mg total) by mouth 3 (three) times daily. 11/04/15   Kem Parkinson, PA-C    Family History Family History  Problem Relation Age of Onset  . Stroke Mother        at age 19  . Hypertension Father   . Asthma Brother   . Other Maternal Grandmother        degenerative spine disease  . Asthma Maternal Grandmother   . Obesity Maternal Grandmother   . Hypertension Maternal Grandmother   . Other Maternal Grandfather        open heart surgery  . Hypertension Maternal Grandfather   . Sickle cell trait Sister   . Asthma Brother     Social History Social History   Tobacco Use  . Smoking status: Former Research scientist (life sciences)  . Smokeless tobacco: Never Used  Substance Use Topics  . Alcohol use: Yes    Comment: occas  . Drug use: No     Allergies   Patient has no known allergies.   Review of Systems Review of Systems  Constitutional: Negative for appetite change, chills and fever.  HENT: Negative for sore throat and trouble  swallowing.   Respiratory: Negative for shortness of breath and wheezing.   Cardiovascular: Positive for chest pain (Right rib pain).  Gastrointestinal: Negative for abdominal pain, nausea and vomiting.  Genitourinary: Negative for difficulty urinating, dysuria, flank pain and hematuria.  Musculoskeletal: Negative for arthralgias, back pain, neck pain and neck stiffness.  Skin: Negative for wound.  Neurological: Negative for weakness and numbness.     Physical Exam Updated Vital Signs BP 107/63 (BP Location: Right Arm)   Pulse (!) 108   Temp 98.8 F (37.1 C) (Oral)   Resp 16   Ht 5\' 3"  (1.6 m)   Wt 74.8 kg (165 lb)   LMP 10/16/2017   SpO2 99%   BMI 29.23 kg/m   Physical Exam  Constitutional: She is oriented to person, place, and time. She appears well-developed and well-nourished. No distress.  HENT:  Head: Normocephalic and atraumatic.    Mouth/Throat: Uvula is midline, oropharynx is clear and moist and mucous membranes are normal.  Neck: Normal range of motion, full passive range of motion without pain and phonation normal. Neck supple.  Cardiovascular: Normal rate, regular rhythm, normal heart sounds and intact distal pulses.  No murmur heard. Pulmonary/Chest: Effort normal. No stridor. No respiratory distress. She has no wheezes. She has no rales. She exhibits tenderness.  ttp of the right lateral and posterior ribs.  No bony deformity or crepitus.  Abdominal: Soft. She exhibits no distension and no mass. There is no tenderness. There is no guarding.  Musculoskeletal: She exhibits no edema.  Lymphadenopathy:    She has no cervical adenopathy.  Neurological: She is alert and oriented to person, place, and time. No sensory deficit. She exhibits normal muscle tone.  Skin: Skin is warm and dry. No rash noted.  Nursing note and vitals reviewed.    ED Treatments / Results  Labs (all labs ordered are listed, but only abnormal results are displayed) Labs Reviewed  COMPREHENSIVE METABOLIC PANEL - Abnormal; Notable for the following components:      Result Value   Total Protein 8.2 (*)    ALT 6 (*)    All other components within normal limits  CBC WITH DIFFERENTIAL/PLATELET - Abnormal; Notable for the following components:   Hemoglobin 11.5 (*)    MCH 25.3 (*)    All other components within normal limits  URINALYSIS, ROUTINE W REFLEX MICROSCOPIC - Abnormal; Notable for the following components:   APPearance HAZY (*)    Hgb urine dipstick SMALL (*)    Leukocytes, UA SMALL (*)    Bacteria, UA RARE (*)    Squamous Epithelial / LPF 6-30 (*)    All other components within normal limits  D-DIMER, QUANTITATIVE (NOT AT [facility]) - Abnormal; Notable for the following components:   D-Dimer, Quant 1.92 (*)    All other components within normal limits  URINE CULTURE  LIPASE, BLOOD  POC URINE PREG, ED     EKG None  Radiology Dg Ribs Unilateral W/chest Right  Result Date: 11/02/2017 CLINICAL DATA:  Right anterior rib pain worse with turning and laying on this side. EXAM: RIGHT RIBS AND CHEST - 3+ VIEW COMPARISON:  None. FINDINGS: No fracture or other bone lesions are seen involving the ribs. There is no evidence of pneumothorax or pleural effusion. Both lungs are clear. Heart size and mediastinal contours are within normal limits. IMPRESSION: Negative. Electronically Signed   By: Ashley Royalty M.D.   On: 11/02/2017 18:26   Ct Angio Chest Pe W And/or Wo Contrast  Result Date: 11/02/2017 CLINICAL DATA:  Right-sided chest pain. EXAM: CT ANGIOGRAPHY CHEST WITH CONTRAST TECHNIQUE: Multidetector CT imaging of the chest was performed using the standard protocol during bolus administration of intravenous contrast. Multiplanar CT image reconstructions and MIPs were obtained to evaluate the vascular anatomy. CONTRAST:  176mL ISOVUE-370 IOPAMIDOL (ISOVUE-370) INJECTION 76% COMPARISON:  Chest x-ray November 02, 2017 FINDINGS: Cardiovascular: The thoracic aorta is normal with no dissection or atherosclerosis. The heart is normal as are visualized coronary arteries. No pulmonary emboli. Mediastinum/Nodes: There is a calcified nodule in the right thyroid lobe. The esophagus is normal. No adenopathy. No effusions. Lungs/Pleura: Lungs are clear. No pleural effusion or pneumothorax. Upper Abdomen: No acute abnormality. Musculoskeletal: No chest wall abnormality. No acute or significant osseous findings. Review of the MIP images confirms the above findings. IMPRESSION: 1. No pulmonary emboli. No cause for the patient's symptoms identified. Electronically Signed   By: Dorise Bullion III M.D   On: 11/02/2017 20:23    Procedures Procedures (including critical care time)  Medications Ordered in ED Medications  HYDROcodone-acetaminophen (NORCO/VICODIN) 5-325 MG per tablet 1 tablet (1 tablet Oral Given 11/02/17 1754)      Initial Impression / Assessment and Plan / ED Course  I have reviewed the triage vital signs and the nursing notes.  Pertinent labs & imaging results that were available during my care of the patient were reviewed by me and considered in my medical decision making (see chart for details).     pt with pleuritic right chest pain and mild tachycardia without known injury.  Elevated D dimer.  Will obtain CT angio   CT angio chest is neg for PE.  Pt feeling better after medication.  Vitals improved.  Appears safe for d/c home, agrees to PCP f/u and return precautions discussed    Final Clinical Impressions(s) / ED Diagnoses   Final diagnoses:  Rib pain on right side    ED Discharge Orders    None       Kem Parkinson, PA-C 11/04/17 1417    Fredia Sorrow, MD 11/07/17 [phone]

## 2017-11-02 NOTE — ED Triage Notes (Signed)
Patient reports pain in her R shoulder and R side, worse with turning motion. Patient unsure if she hurt it at work. States pain is worse when she lays on that side.

## 2017-11-02 NOTE — Discharge Instructions (Signed)
Apply ice packs on and off to your back and chest wall.  Avoid heavy lifting reaching or bending for 1 week.  Follow-up with your primary doctor next week or return to the ER for any worsening symptoms such as fever, abdominal pain, vomiting, shortness of breath or increased pain.

## 2017-11-04 LAB — URINE CULTURE: Culture: NO GROWTH

## 2019-10-01 IMAGING — DX DG RIBS W/ CHEST 3+V*R*
4 series · 4 of 4 positions shown · non-contrast
Comparison: None.

CLINICAL DATA: Right anterior rib pain worse with turning and
laying on this side.

EXAM:
RIGHT RIBS AND CHEST - 3+ VIEW

[chest pa]
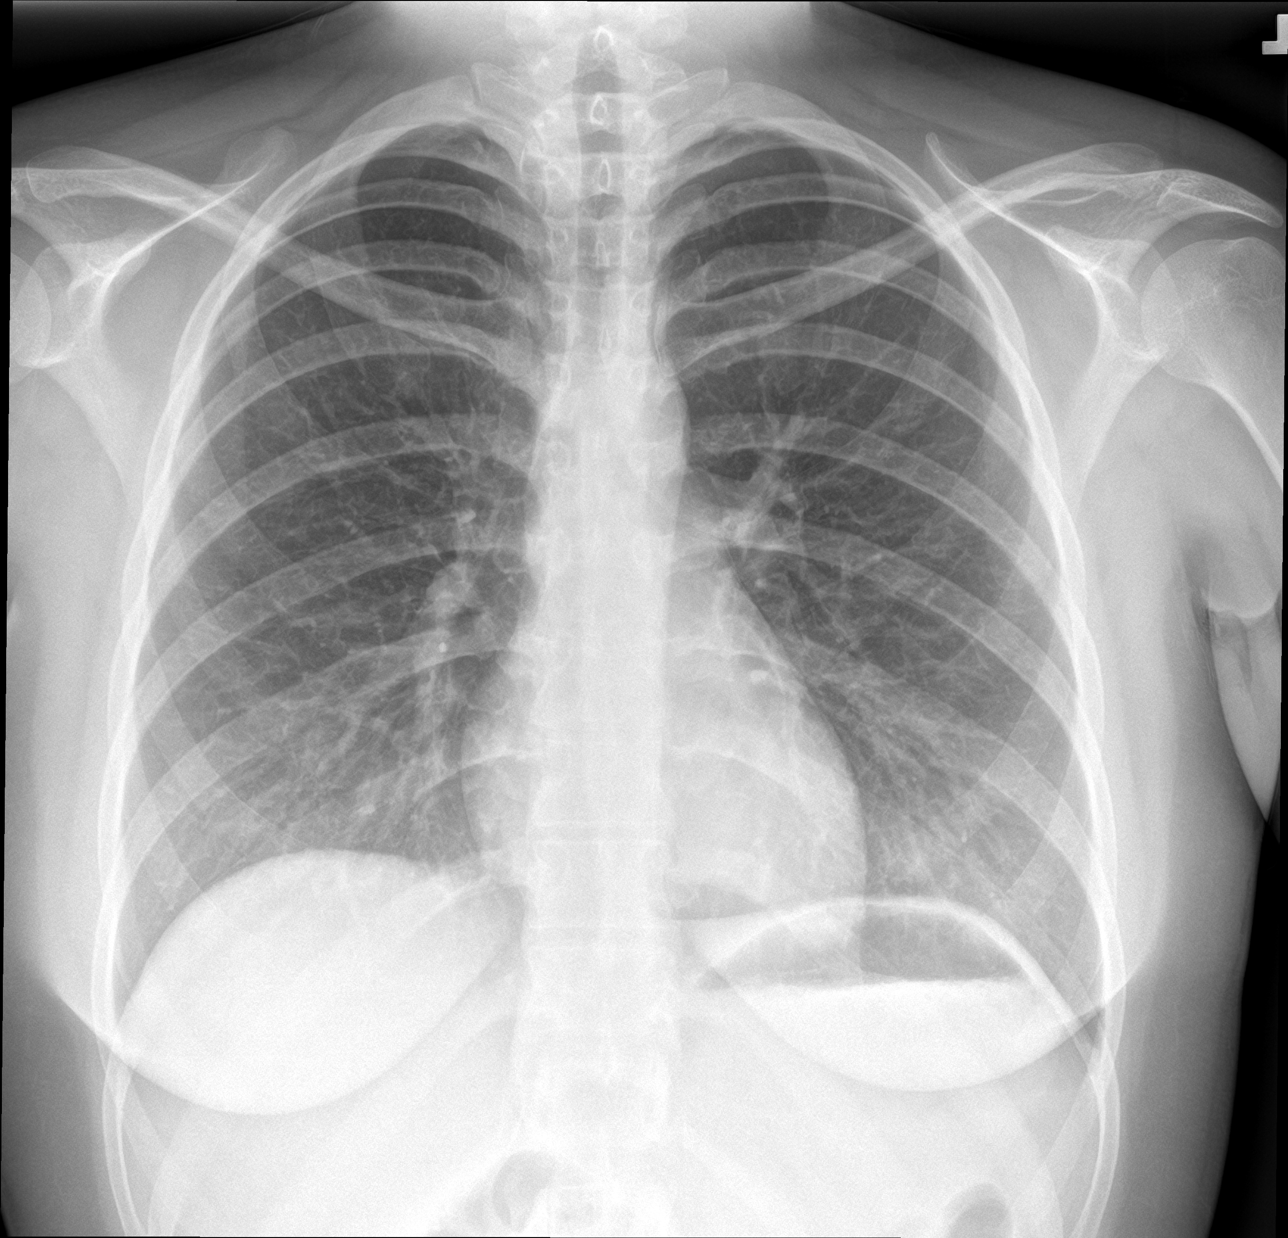

[rib pa]
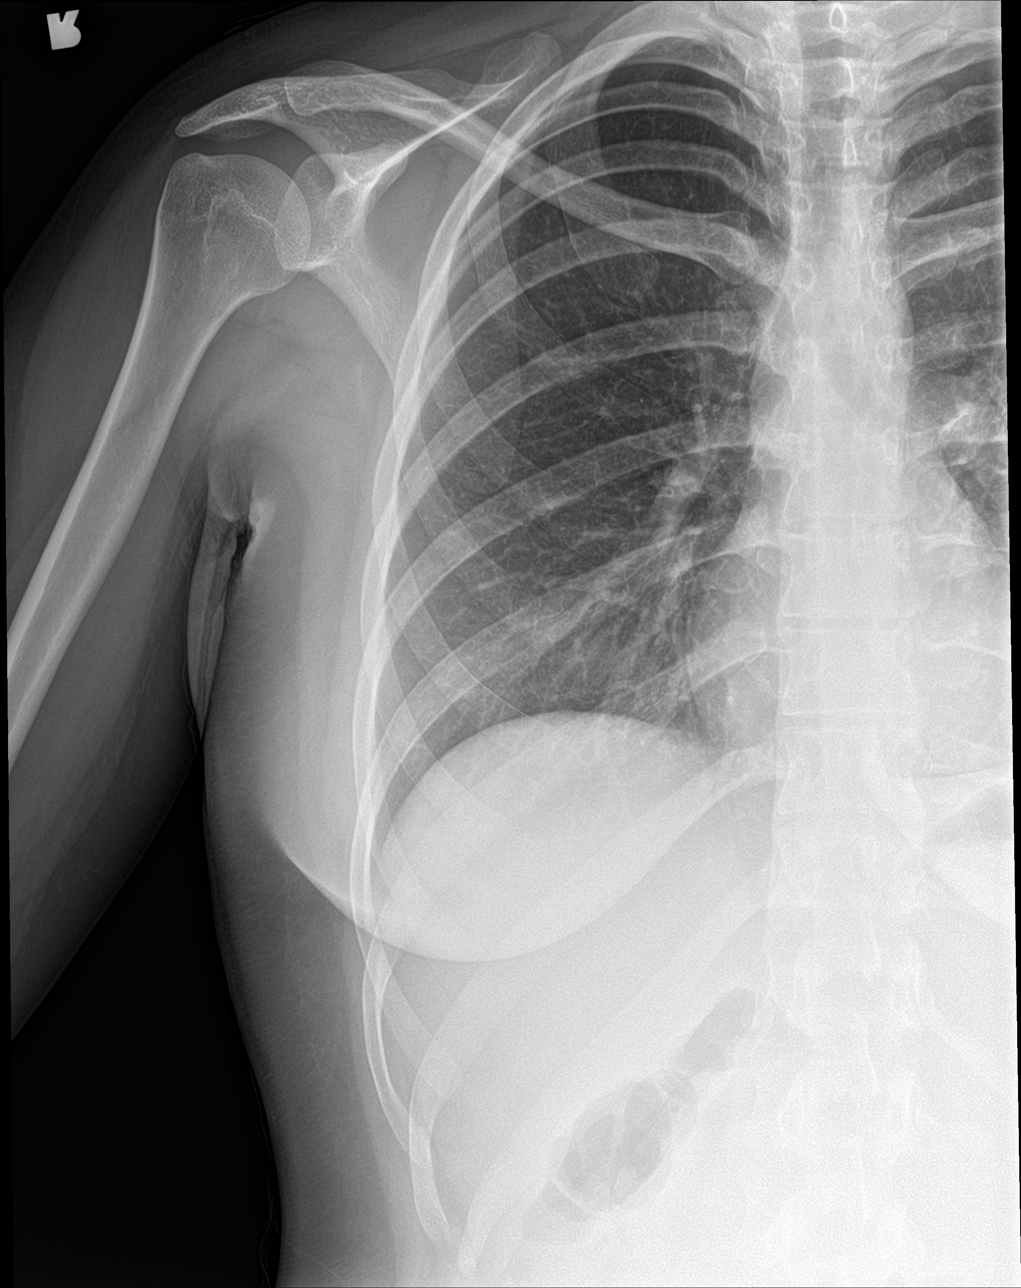

[rib pa obl (1 of 2)]
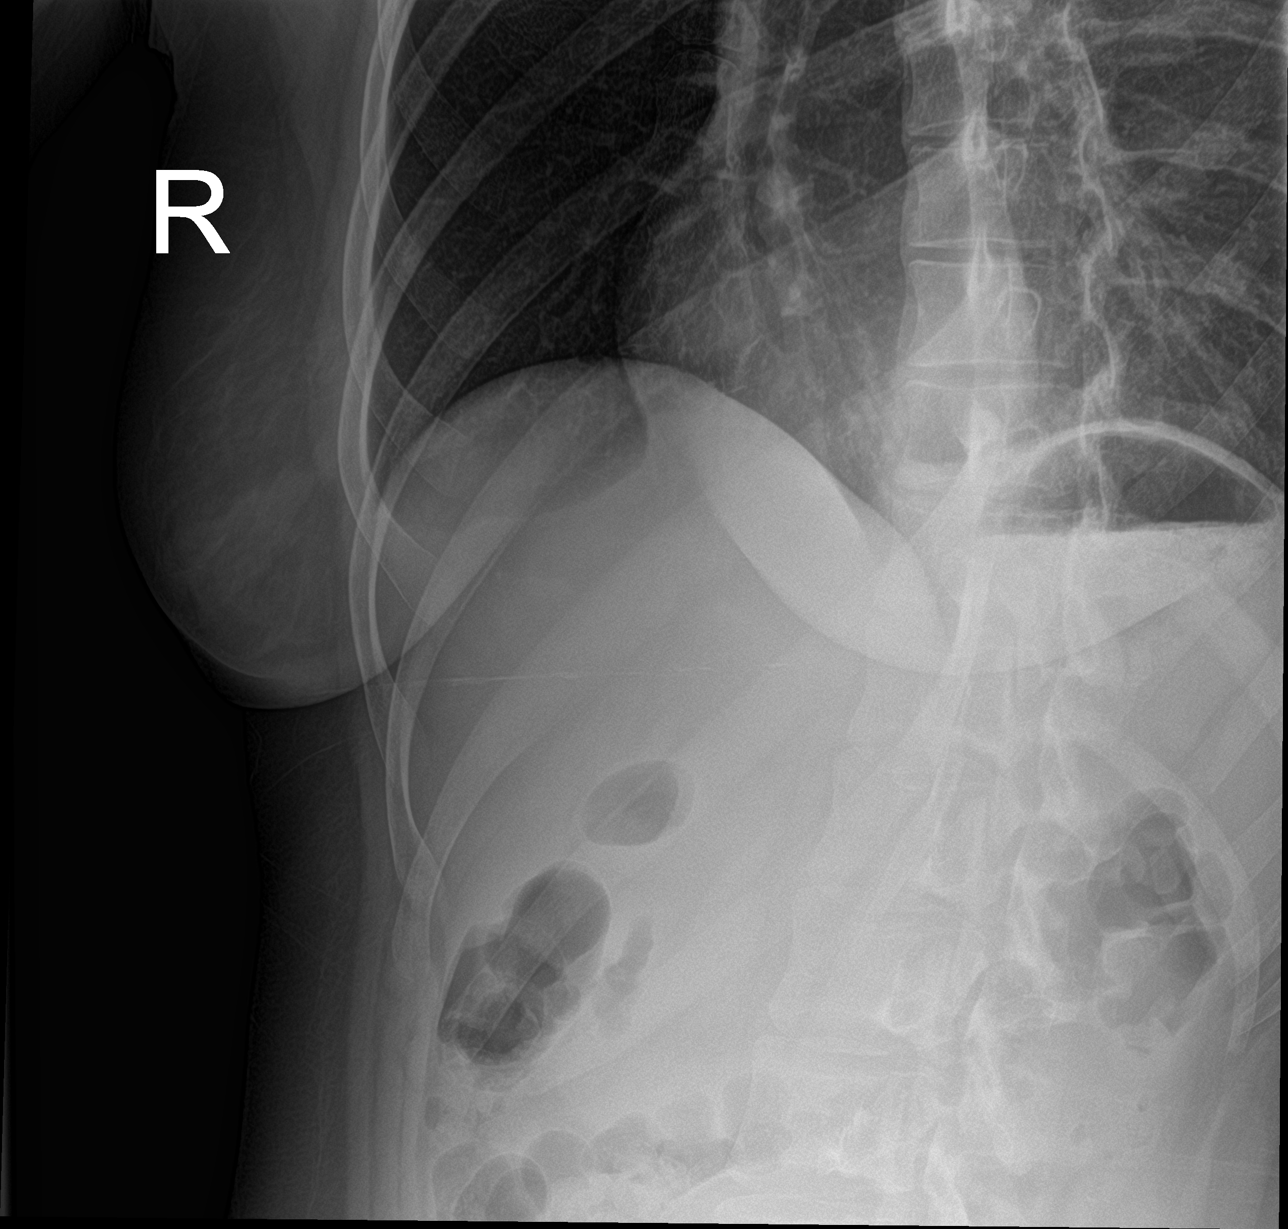

[rib pa obl (2 of 2)]
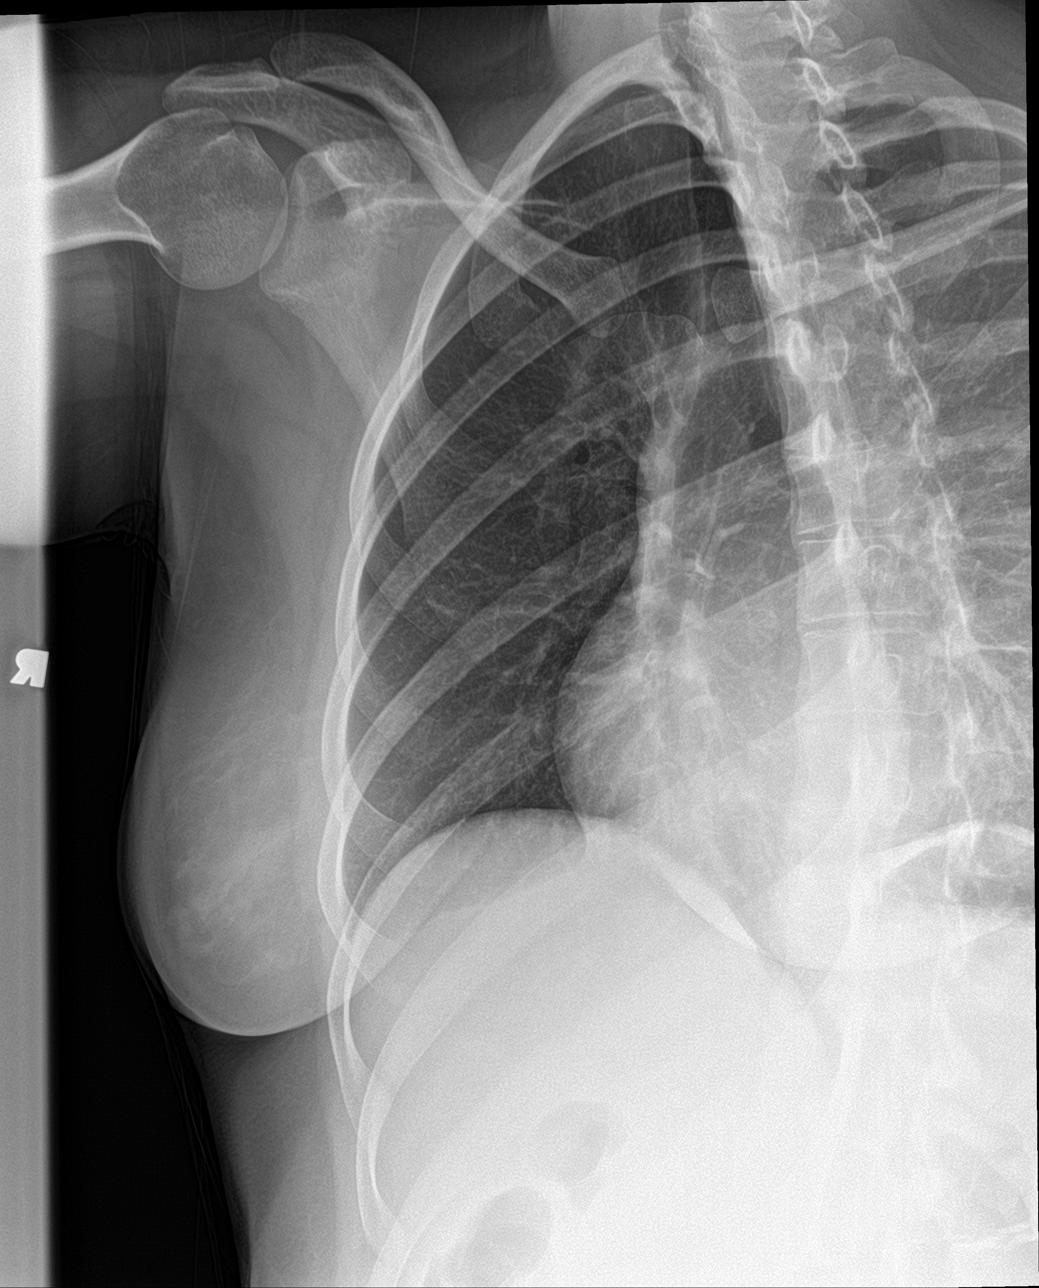

[4 of 4 positions shown; findings below may reference images not displayed]

FINDINGS: No fracture or other bone lesions are seen involving the ribs. There
is no evidence of pneumothorax or pleural effusion. Both lungs are
clear. Heart size and mediastinal contours are within normal limits.
IMPRESSION: Negative.

## 2020-12-06 ENCOUNTER — Other Ambulatory Visit: Payer: Self-pay

## 2020-12-06 ENCOUNTER — Other Ambulatory Visit (HOSPITAL_COMMUNITY)
Admission: RE | Admit: 2020-12-06 | Discharge: 2020-12-06 | Disposition: A | Discharge status: Home / Self Care | Payer: Medicaid Other | Source: Ambulatory Visit | Attending: Obstetrics & Gynecology | Admitting: Obstetrics & Gynecology

## 2020-12-06 ENCOUNTER — Encounter: Payer: Self-pay | Admitting: Women's Health

## 2020-12-06 ENCOUNTER — Ambulatory Visit (INDEPENDENT_AMBULATORY_CARE_PROVIDER_SITE_OTHER): Payer: BC Managed Care – PPO | Admitting: Women's Health

## 2020-12-06 VITALS — BP 102/62 | HR 76 | Ht 63.0 in | Wt 163.6 lb

## 2020-12-06 DIAGNOSIS — Z0000 Encounter for general adult medical examination without abnormal findings: Secondary | ICD-10-CM | POA: Diagnosis present

## 2020-12-06 DIAGNOSIS — Z3009 Encounter for other general counseling and advice on contraception: Secondary | ICD-10-CM | POA: Insufficient documentation

## 2020-12-06 DIAGNOSIS — D34 Benign neoplasm of thyroid gland: Secondary | ICD-10-CM

## 2020-12-06 DIAGNOSIS — Z01419 Encounter for gynecological examination (general) (routine) without abnormal findings: Secondary | ICD-10-CM

## 2020-12-06 DIAGNOSIS — Z113 Encounter for screening for infections with a predominantly sexual mode of transmission: Secondary | ICD-10-CM | POA: Insufficient documentation

## 2020-12-06 NOTE — Progress Notes (Signed)
WELL-WOMAN EXAMINATION Patient name: Caitlyn Chen MRN [id number]  Date of birth: October 12, 1995 Chief Complaint:   Gynecologic Exam (Last pap 10-05-19 health department. Pap normal)  History of Present Illness:   Caitlyn Chen is a 25 y.o. G0P0000 Caucasian female being seen today for a routine FP Mcaid well-woman exam.  Current complaints: none. 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 Wants to check on it again.   Depression screen [facility] 2/9 12/06/2020 08/03/2015 07/04/2015  Decreased Interest 1 0 0  Down, Depressed, Hopeless 1 0 0  PHQ - 2 Score 2 0 0  Altered sleeping 2 - -  Tired, decreased energy 2 - -  Change in appetite 0 - -  Feeling bad or failure about yourself  2 - -  Trouble concentrating 1 - -  Moving slowly or fidgety/restless 0 - -  Suicidal thoughts 1 - -  PHQ-9 Score 10 - -     PCP: none      Patient's last menstrual period was 11/26/2020. The current method of family planning is abstinence.  Last pap 2021. Results were: negative per pt report at [facility] H/O abnormal pap: no Last mammogram: never. Results were: N/A. Family h/o breast cancer: no Last colonoscopy: never. Results were: N/A. Family h/o colorectal cancer: no Review of Systems:   Pertinent items are noted in HPI Denies any headaches, blurred vision, fatigue, shortness of breath, chest pain, abdominal pain, abnormal vaginal discharge/itching/odor/irritation, problems with periods, bowel movements, urination, or intercourse unless otherwise stated above. Pertinent History Reviewed:  Reviewed past medical,surgical, social and family history.  Reviewed problem list, medications and allergies. Physical Assessment:   Vitals:   12/06/20 1035  BP: 102/62  Pulse: 76  Weight: 163 lb 9.6 oz (74.2 kg)  Height: 5\' 3"  (1.6 m)  Body mass index is 28.98 kg/m.        Physical Examination:   General appearance - well appearing, and in no distress  Mental status - alert, oriented to person, place, and  time  Psych:  She has a normal mood and affect  Skin - warm and dry, normal color, no suspicious lesions noted  Chest - effort normal, all lung fields clear to auscultation bilaterally  Heart - normal rate and regular rhythm  Neck:  midline trachea, no thyromegaly or nodules  Breasts - breasts appear normal, no suspicious masses, no skin or nipple changes or  axillary nodes  Abdomen - soft, nontender, nondistended, no masses or organomegaly  Pelvic - VULVA: normal appearing vulva with no masses, tenderness or lesions  VAGINA: normal appearing vagina with normal color and discharge, no lesions  CERVIX: normal appearing cervix without discharge or lesions, no CMT  Thin prep pap is not done  UTERUS: uterus is felt to be normal size, shape, consistency and nontender   ADNEXA: No adnexal masses or tenderness noted.  Extremities:  No swelling or varicosities noted  Chaperone: Celene Squibb    No results found for this or any previous visit (from the past 24 hour(s)).  Assessment & Plan:  1) Well-Woman FP Mcaid Exam  2) STD screen  3) H/O Rt thyroid adenoma> wants to f/u, u/s ordered, understands FP Mcaid will not cover. Note routed to [facility] to check on price and let pt know.   Labs/procedures today: CV swab  Mammogram: @ 26yo, or sooner if problems Colonoscopy: @ 26yo, or sooner if problems  Orders Placed This Encounter  Procedures  . US THYROID  . HIV Antibody (  routine testing w rflx)  . RPR    Meds: No orders of the defined types were placed in this encounter.   Follow-up: Return for 2yr for physical; get pap records from [facility] please.  [place], [facility] 12/06/2020 11:23 AM

## 2020-12-06 NOTE — Addendum Note (Signed)
Addended by: Linton Rump on: 12/06/2020 11:34 AM   Modules accepted: Orders

## 2020-12-07 ENCOUNTER — Other Ambulatory Visit: Payer: Self-pay | Admitting: Women's Health

## 2020-12-07 DIAGNOSIS — A599 Trichomoniasis, unspecified: Secondary | ICD-10-CM | POA: Insufficient documentation

## 2020-12-07 LAB — RPR: RPR Ser Ql: NONREACTIVE

## 2020-12-07 LAB — HIV ANTIBODY (ROUTINE TESTING W REFLEX): HIV Screen 4th Generation wRfx: NONREACTIVE

## 2020-12-07 LAB — CERVICOVAGINAL ANCILLARY ONLY
Chlamydia: NEGATIVE
Comment: NEGATIVE
Comment: NEGATIVE
Comment: NORMAL
Neisseria Gonorrhea: NEGATIVE
Trichomonas: POSITIVE — AB

## 2020-12-07 MED ORDER — METRONIDAZOLE 500 MG PO TABS: 2000.0000 mg | ORAL_TABLET | Freq: Once | ORAL | 0 refills | Status: AC

## 2020-12-08 ENCOUNTER — Encounter: Payer: Self-pay | Admitting: Women's Health

## 2020-12-08 DIAGNOSIS — Z8742 Personal history of other diseases of the female genital tract: Secondary | ICD-10-CM | POA: Insufficient documentation

## 2021-01-10 ENCOUNTER — Other Ambulatory Visit (HOSPITAL_COMMUNITY)
Admission: RE | Admit: 2021-01-10 | Discharge: 2021-01-10 | Disposition: A | Discharge status: Home / Self Care | Payer: BC Managed Care – PPO | Source: Ambulatory Visit | Attending: Obstetrics & Gynecology | Admitting: Obstetrics & Gynecology

## 2021-01-10 ENCOUNTER — Encounter: Payer: Self-pay | Admitting: Women's Health

## 2021-01-10 ENCOUNTER — Ambulatory Visit (INDEPENDENT_AMBULATORY_CARE_PROVIDER_SITE_OTHER): Payer: BC Managed Care – PPO | Admitting: Women's Health

## 2021-01-10 ENCOUNTER — Other Ambulatory Visit: Payer: Self-pay

## 2021-01-10 VITALS — BP 102/62 | HR 75 | Ht 63.0 in | Wt 164.0 lb

## 2021-01-10 DIAGNOSIS — Z1151 Encounter for screening for human papillomavirus (HPV): Secondary | ICD-10-CM | POA: Insufficient documentation

## 2021-01-10 DIAGNOSIS — A599 Trichomoniasis, unspecified: Secondary | ICD-10-CM

## 2021-01-10 DIAGNOSIS — R87810 Cervical high risk human papillomavirus (HPV) DNA test positive: Secondary | ICD-10-CM | POA: Insufficient documentation

## 2021-01-10 DIAGNOSIS — Z01419 Encounter for gynecological examination (general) (routine) without abnormal findings: Secondary | ICD-10-CM

## 2021-01-10 DIAGNOSIS — Z124 Encounter for screening for malignant neoplasm of cervix: Secondary | ICD-10-CM

## 2021-01-10 DIAGNOSIS — Z113 Encounter for screening for infections with a predominantly sexual mode of transmission: Secondary | ICD-10-CM | POA: Insufficient documentation

## 2021-01-10 NOTE — Progress Notes (Signed)
   GYN VISIT Patient name: Caitlyn Chen MRN [id number]  Date of birth: [birth year] Chief Complaint:   Gynecologic Exam (Pap smear)  History of Present Illness:   Caitlyn Chen is a 25 y.o. [place] female being seen today for pap smear only (recently had FP Mcaid physical visit and thought her most recent pap was normal, but when we received records from [facility]- it was abnormal).    Recent +trichomonas, treated w/ metronidazole 12/07/20 Patient's last menstrual period was 12/26/2020. The current method of family planning is abstinence.  Last pap 09/15/19 at [facility]. Results were: ASCUS w/ HRHPV not done  Depression screen [facility] 2/9 01/10/2021 12/06/2020 08/03/2015 07/04/2015  Decreased Interest 2 1 0 0  Down, Depressed, Hopeless 2 1 0 0  PHQ - 2 Score 4 2 0 0  Altered sleeping 2 2 - -  Tired, decreased energy 2 2 - -  Change in appetite 0 0 - -  Feeling bad or failure about yourself  2 2 - -  Trouble concentrating 0 1 - -  Moving slowly or fidgety/restless 1 0 - -  Suicidal thoughts 0 1 - -  PHQ-9 Score 11 10 - -     GAD 7 : Generalized Anxiety Score 01/10/2021 12/06/2020  Nervous, Anxious, on Edge 1 1  Control/stop worrying 0 1  Worry too much - different things 2 1  Trouble relaxing 2 1  Restless 0 0  Easily annoyed or irritable 2 2  Afraid - awful might happen 0 1  Total GAD 7 Score 7 7     Review of Systems:   Pertinent items are noted in HPI Denies fever/chills, dizziness, headaches, visual disturbances, fatigue, shortness of breath, chest pain, abdominal pain, vomiting, abnormal vaginal discharge/itching/odor/irritation, problems with periods, bowel movements, urination, or intercourse unless otherwise stated above.  Pertinent History Reviewed:  Reviewed past medical,surgical, social, obstetrical and family history.  Reviewed problem list, medications and allergies. Physical Assessment:   Vitals:   01/10/21 1010  BP: 102/62  Pulse: 75  Weight: 164 lb (74.4 kg)  Height:  5\' 3"  (1.6 m)  Body mass index is 29.05 kg/m.       Physical Examination:   General appearance: alert, well appearing, and in no distress  Mental status: alert, oriented to person, place, and time  Skin: warm & dry   Cardiovascular: normal heart rate noted  Respiratory: normal respiratory effort, no distress  Abdomen: soft, non-tender   Pelvic: VULVA: normal appearing vulva with no masses, tenderness or lesions, VAGINA: normal appearing vagina with normal color and discharge, no lesions, CERVIX: normal appearing cervix without discharge or lesions  Extremities: no edema   Chaperone: Celene Squibb    No results found for this or any previous visit (from the past 24 hour(s)).  Assessment & Plan:  1) Screening for cervical cancer> pap today, h/o abnormal  2) Recent trichomonas> POC today on pap  Meds: No orders of the defined types were placed in this encounter.   No orders of the defined types were placed in this encounter.   Return in about 1 year (around 01/10/2022) for Pap & physical.  Roma Schanz CNM, [facility] 01/10/2021 10:50 AM

## 2021-01-16 LAB — CYTOLOGY - PAP
Chlamydia: NEGATIVE
Comment: NEGATIVE
Comment: NEGATIVE
Comment: NEGATIVE
Comment: NEGATIVE
Comment: NORMAL
Diagnosis: NEGATIVE
HPV 16: NEGATIVE
HPV 18 / 45: NEGATIVE
High risk HPV: POSITIVE — AB
Neisseria Gonorrhea: NEGATIVE
Trichomonas: NEGATIVE

## 2022-06-28 ENCOUNTER — Ambulatory Visit: Payer: BC Managed Care – PPO | Admitting: Obstetrics & Gynecology

## 2022-07-03 ENCOUNTER — Other Ambulatory Visit (HOSPITAL_COMMUNITY)
Admission: RE | Admit: 2022-07-03 | Discharge: 2022-07-03 | Disposition: A | Discharge status: Home / Self Care | Payer: BC Managed Care – PPO | Source: Ambulatory Visit | Attending: Obstetrics & Gynecology | Admitting: Obstetrics & Gynecology

## 2022-07-03 ENCOUNTER — Encounter: Payer: Self-pay | Admitting: Obstetrics & Gynecology

## 2022-07-03 ENCOUNTER — Ambulatory Visit (INDEPENDENT_AMBULATORY_CARE_PROVIDER_SITE_OTHER): Payer: BC Managed Care – PPO | Admitting: Obstetrics & Gynecology

## 2022-07-03 VITALS — Ht 63.0 in | Wt 164.0 lb

## 2022-07-03 DIAGNOSIS — Z01419 Encounter for gynecological examination (general) (routine) without abnormal findings: Secondary | ICD-10-CM | POA: Diagnosis not present

## 2022-07-03 DIAGNOSIS — Z113 Encounter for screening for infections with a predominantly sexual mode of transmission: Secondary | ICD-10-CM

## 2022-07-03 NOTE — Progress Notes (Signed)
WELL-WOMAN EXAMINATION Patient name: Caitlyn Chen MRN [id number]  Date of birth: 1995/10/18 Chief Complaint:   Gynecologic Exam  History of Present Illness:   Caitlyn Chen is a 26 y.o. G0P0000 female being seen today for a routine well-woman exam.  Today she notes no acute complaints or concerns  Menses seem to be monthly, but not always around the same time- doesn't really track.  Denies HMB or intermenstrual bleeding.  Does not report issue with her menses.  Denies abnormal discharge, itching or irritation.  Denies pelvic or abdominal pain.  No acute complaints  New partner- using condoms.  Not interested in other forms of contraception at this time  No LMP recorded (lmp unknown).  The current method of family planning is condoms.    Last pap HPV +.  Last mammogram: n/a. Last colonoscopy: n/a     07/03/2022    2:44 PM 01/10/2021   10:12 AM 12/06/2020   10:45 AM 08/03/2015    2:37 PM 07/04/2015   11:14 AM  Depression screen PHQ 2/9  Decreased Interest 0 2 1 0 0  Down, Depressed, Hopeless '1 2 1 '$ 0 0  PHQ - 2 Score '1 4 2 '$ 0 0  Altered sleeping 0 2 2    Tired, decreased energy '1 2 2    '$ Change in appetite 0 0 0    Feeling bad or failure about yourself  0 2 2    Trouble concentrating 0 0 1    Moving slowly or fidgety/restless 0 1 0    Suicidal thoughts 0 0 1    PHQ-9 Score '2 11 10        '$ Review of Systems:   Pertinent items are noted in HPI Denies any headaches, blurred vision, fatigue, shortness of breath, chest pain, abdominal pain, bowel movements, urination, or intercourse unless otherwise stated above.  Pertinent History Reviewed:  Reviewed past medical,surgical, social and family history.  Reviewed problem list, medications and allergies. Physical Assessment:   Vitals:   07/03/22 1439  Weight: 164 lb (74.4 kg)  Height: '5\' 3"'$  (1.6 m)  Body mass index is 29.05 kg/m.        Physical Examination:   General appearance - well appearing, and in no  distress  Mental status - alert, oriented to person, place, and time  Psych:  She has a normal mood and affect  Skin - warm and dry, normal color, no suspicious lesions noted  Chest - effort normal, all lung fields clear to auscultation bilaterally  Heart - normal rate and regular rhythm  Neck:  midline trachea, no thyromegaly or nodules  Breasts - breasts appear normal, no suspicious masses, no skin or nipple changes or  axillary nodes  Abdomen - soft, nontender, nondistended, no masses or organomegaly  Pelvic - VULVA: normal appearing vulva with no masses, tenderness or lesions  VAGINA: normal appearing vagina with normal color and discharge, no lesions  CERVIX: normal appearing cervix without discharge or lesions, no CMT  Thin prep pap is done with HR HPV cotesting  UTERUS: uterus is felt to be normal size, shape, consistency and nontender   ADNEXA: No adnexal masses or tenderness noted.  Extremities:  No swelling or varicosities noted  Chaperone:  pt declined      Assessment & Plan:  1) Well-Woman Exam -pap collected, reviewed screening guidelines  2) STI screening Testing ordered today  Orders Placed This Encounter  Procedures   RPR   HIV Antibody (routine testing w  rflx)    Follow-up: Return in about 1 year (around 07/04/2023) for Annual.   Janyth Pupa, DO Attending [place], [place] for [facility], [place]

## 2022-07-04 LAB — RPR: RPR Ser Ql: NONREACTIVE

## 2022-07-04 LAB — HIV ANTIBODY (ROUTINE TESTING W REFLEX): HIV Screen 4th Generation wRfx: NONREACTIVE

## 2022-07-09 ENCOUNTER — Other Ambulatory Visit: Payer: Self-pay | Admitting: Obstetrics & Gynecology

## 2022-07-09 DIAGNOSIS — A749 Chlamydial infection, unspecified: Secondary | ICD-10-CM

## 2022-07-09 LAB — CYTOLOGY - PAP
Chlamydia: POSITIVE — AB
Comment: NEGATIVE
Comment: NEGATIVE
Comment: NORMAL
Diagnosis: NEGATIVE
High risk HPV: NEGATIVE
Neisseria Gonorrhea: NEGATIVE

## 2022-07-09 MED ORDER — AZITHROMYCIN 500 MG PO TABS: 1000.0000 mg | ORAL_TABLET | Freq: Once | ORAL | 1 refills | Status: AC

## 2022-07-09 NOTE — Progress Notes (Signed)
Rx for azithromycin for chlamydia

## 2022-07-10 ENCOUNTER — Encounter: Payer: Self-pay | Admitting: Obstetrics & Gynecology

## 2022-07-31 ENCOUNTER — Other Ambulatory Visit (INDEPENDENT_AMBULATORY_CARE_PROVIDER_SITE_OTHER): Payer: BC Managed Care – PPO

## 2022-07-31 DIAGNOSIS — Z09 Encounter for follow-up examination after completed treatment for conditions other than malignant neoplasm: Secondary | ICD-10-CM

## 2022-07-31 DIAGNOSIS — Z8619 Personal history of other infectious and parasitic diseases: Secondary | ICD-10-CM

## 2022-07-31 NOTE — Progress Notes (Addendum)
   NURSE VISIT- STD  SUBJECTIVE:  Caitlyn Chen is a 26 y.o. G0P0000 GYN patientfemale here for a vaginal swab for proof of cure after treatment for Chlamydia.  She reports the following symptoms: none Denies abnormal vaginal bleeding, significant pelvic pain, fever, or UTI symptoms.  OBJECTIVE:  LMP  (LMP Unknown)   Appears well, in no apparent distress  ASSESSMENT: Vaginal swab for STD screen  PLAN: Urine collected for Chlamydia sent to lab Treatment: to be determined once results are received Follow-up as needed if symptoms persist/worsen, or new symptoms develop  Alice Rieger  07/31/2022 11:36 AM

## 2022-08-02 LAB — GC/CHLAMYDIA PROBE AMP
Chlamydia trachomatis, NAA: NEGATIVE
Neisseria Gonorrhoeae by PCR: NEGATIVE

## 2022-10-02 ENCOUNTER — Other Ambulatory Visit (HOSPITAL_COMMUNITY): Payer: Self-pay | Admitting: Family Medicine

## 2022-10-02 DIAGNOSIS — E669 Obesity, unspecified: Secondary | ICD-10-CM | POA: Diagnosis not present

## 2022-10-02 DIAGNOSIS — E041 Nontoxic single thyroid nodule: Secondary | ICD-10-CM | POA: Diagnosis not present

## 2022-10-02 DIAGNOSIS — Z6830 Body mass index (BMI) 30.0-30.9, adult: Secondary | ICD-10-CM | POA: Diagnosis not present

## 2022-10-02 DIAGNOSIS — F17200 Nicotine dependence, unspecified, uncomplicated: Secondary | ICD-10-CM | POA: Diagnosis not present

## 2022-10-09 ENCOUNTER — Ambulatory Visit (HOSPITAL_COMMUNITY)
Admission: RE | Admit: 2022-10-09 | Discharge: 2022-10-09 | Disposition: A | Discharge status: Home / Self Care | Payer: BC Managed Care – PPO | Source: Ambulatory Visit | Attending: Family Medicine | Admitting: Family Medicine

## 2022-10-09 DIAGNOSIS — E041 Nontoxic single thyroid nodule: Secondary | ICD-10-CM | POA: Insufficient documentation

## 2022-10-09 DIAGNOSIS — E042 Nontoxic multinodular goiter: Secondary | ICD-10-CM | POA: Diagnosis not present

## 2022-10-11 DIAGNOSIS — E041 Nontoxic single thyroid nodule: Secondary | ICD-10-CM | POA: Diagnosis not present

## 2022-10-11 DIAGNOSIS — Z6830 Body mass index (BMI) 30.0-30.9, adult: Secondary | ICD-10-CM | POA: Diagnosis not present

## 2022-10-17 DIAGNOSIS — R7989 Other specified abnormal findings of blood chemistry: Secondary | ICD-10-CM | POA: Diagnosis not present

## 2022-10-17 DIAGNOSIS — Z6830 Body mass index (BMI) 30.0-30.9, adult: Secondary | ICD-10-CM | POA: Diagnosis not present

## 2022-10-17 DIAGNOSIS — Z6831 Body mass index (BMI) 31.0-31.9, adult: Secondary | ICD-10-CM | POA: Diagnosis not present

## 2022-10-17 DIAGNOSIS — E041 Nontoxic single thyroid nodule: Secondary | ICD-10-CM | POA: Diagnosis not present

## 2022-10-17 DIAGNOSIS — E559 Vitamin D deficiency, unspecified: Secondary | ICD-10-CM | POA: Diagnosis not present

## 2022-10-17 DIAGNOSIS — R0600 Dyspnea, unspecified: Secondary | ICD-10-CM | POA: Diagnosis not present

## 2022-10-17 DIAGNOSIS — Z0001 Encounter for general adult medical examination with abnormal findings: Secondary | ICD-10-CM | POA: Diagnosis not present

## 2022-10-17 DIAGNOSIS — E669 Obesity, unspecified: Secondary | ICD-10-CM | POA: Diagnosis not present

## 2023-01-09 ENCOUNTER — Other Ambulatory Visit (INDEPENDENT_AMBULATORY_CARE_PROVIDER_SITE_OTHER): Payer: BC Managed Care – PPO | Admitting: *Deleted

## 2023-01-09 ENCOUNTER — Other Ambulatory Visit (HOSPITAL_COMMUNITY)
Admission: RE | Admit: 2023-01-09 | Discharge: 2023-01-09 | Disposition: A | Discharge status: Home / Self Care | Payer: BC Managed Care – PPO | Source: Ambulatory Visit | Attending: Obstetrics & Gynecology | Admitting: Obstetrics & Gynecology

## 2023-01-09 DIAGNOSIS — L259 Unspecified contact dermatitis, unspecified cause: Secondary | ICD-10-CM | POA: Diagnosis not present

## 2023-01-09 DIAGNOSIS — Z113 Encounter for screening for infections with a predominantly sexual mode of transmission: Secondary | ICD-10-CM

## 2023-01-09 NOTE — Progress Notes (Signed)
   NURSE VISIT- VAGINITIS/STD/POC  SUBJECTIVE:  Caitlyn Chen is a 27 y.o. G0P0000 GYN patientfemale here for a vaginal swab for STD screen.  She reports the following symptoms: none for 0 days. Denies abnormal vaginal bleeding, significant pelvic pain, fever, or UTI symptoms.  OBJECTIVE:  There were no vitals taken for this visit.  Appears well, in no apparent distress  ASSESSMENT: Vaginal swab for STD screen  PLAN: Self-collected vaginal probe for Gonorrhea, Chlamydia, Trichomonas, Bacterial Vaginosis, Yeast sent to lab Treatment: to be determined once results are received Follow-up as needed if symptoms persist/worsen, or new symptoms develop  Annamarie Dawley  01/09/2023 10:06 AM

## 2023-01-10 ENCOUNTER — Other Ambulatory Visit: Payer: Self-pay | Admitting: Adult Health

## 2023-01-10 LAB — CERVICOVAGINAL ANCILLARY ONLY
Bacterial Vaginitis (gardnerella): POSITIVE — AB
Candida Glabrata: NEGATIVE
Candida Vaginitis: NEGATIVE
Chlamydia: NEGATIVE
Comment: NEGATIVE
Comment: NEGATIVE
Comment: NEGATIVE
Comment: NEGATIVE
Comment: NEGATIVE
Comment: NORMAL
Neisseria Gonorrhea: NEGATIVE
Trichomonas: NEGATIVE

## 2023-01-10 MED ORDER — METRONIDAZOLE 500 MG PO TABS: 500.0000 mg | ORAL_TABLET | Freq: Two times a day (BID) | ORAL | 0 refills | Status: DC

## 2023-01-10 NOTE — Progress Notes (Signed)
+  BV on vaginal swab, will rx flagyl, no sex or alcohol while taking meds  

## 2023-02-02 DIAGNOSIS — R195 Other fecal abnormalities: Secondary | ICD-10-CM | POA: Diagnosis not present

## 2023-02-04 DIAGNOSIS — R195 Other fecal abnormalities: Secondary | ICD-10-CM | POA: Diagnosis not present

## 2023-08-12 ENCOUNTER — Other Ambulatory Visit (HOSPITAL_COMMUNITY)
Admission: RE | Admit: 2023-08-12 | Discharge: 2023-08-12 | Disposition: A | Discharge status: Home / Self Care | Payer: BC Managed Care – PPO | Source: Ambulatory Visit | Attending: Obstetrics & Gynecology | Admitting: Obstetrics & Gynecology

## 2023-08-12 ENCOUNTER — Other Ambulatory Visit (INDEPENDENT_AMBULATORY_CARE_PROVIDER_SITE_OTHER): Payer: BC Managed Care – PPO | Admitting: *Deleted

## 2023-08-12 DIAGNOSIS — N760 Acute vaginitis: Secondary | ICD-10-CM | POA: Insufficient documentation

## 2023-08-12 DIAGNOSIS — B9689 Other specified bacterial agents as the cause of diseases classified elsewhere: Secondary | ICD-10-CM | POA: Insufficient documentation

## 2023-08-12 DIAGNOSIS — Z113 Encounter for screening for infections with a predominantly sexual mode of transmission: Secondary | ICD-10-CM | POA: Diagnosis present

## 2023-08-12 NOTE — Progress Notes (Signed)
   NURSE VISIT- VAGINITIS/STD/POC  SUBJECTIVE:  Caitlyn Chen is a 27 y.o. G0P0000 GYN patientfemale here for a vaginal swab for STD screen.  She reports the following symptoms: none for 0 days. Denies abnormal vaginal bleeding, significant pelvic pain, fever, or UTI symptoms.  OBJECTIVE:  There were no vitals taken for this visit.  Appears well, in no apparent distress  ASSESSMENT: Vaginal swab for STD screen  PLAN: Self-collected vaginal probe for Gonorrhea, Chlamydia, Trichomonas, Bacterial Vaginosis, Yeast sent to lab Treatment: to be determined once results are received Follow-up as needed if symptoms persist/worsen, or new symptoms develop  Annamarie Dawley  08/12/2023 2:38 PM

## 2023-08-13 ENCOUNTER — Other Ambulatory Visit: Payer: BC Managed Care – PPO

## 2023-08-15 LAB — CERVICOVAGINAL ANCILLARY ONLY
Bacterial Vaginitis (gardnerella): POSITIVE — AB
Candida Glabrata: NEGATIVE
Candida Vaginitis: NEGATIVE
Chlamydia: NEGATIVE
Comment: NEGATIVE
Comment: NEGATIVE
Comment: NEGATIVE
Comment: NEGATIVE
Comment: NEGATIVE
Comment: NORMAL
Neisseria Gonorrhea: NEGATIVE
Trichomonas: NEGATIVE

## 2023-08-16 ENCOUNTER — Other Ambulatory Visit: Payer: Self-pay | Admitting: Adult Health

## 2023-08-16 MED ORDER — METRONIDAZOLE 500 MG PO TABS: 500.0000 mg | ORAL_TABLET | Freq: Two times a day (BID) | ORAL | 0 refills | Status: DC

## 2023-10-23 ENCOUNTER — Ambulatory Visit

## 2023-12-23 ENCOUNTER — Ambulatory Visit

## 2023-12-23 ENCOUNTER — Other Ambulatory Visit (HOSPITAL_COMMUNITY)
Admission: RE | Admit: 2023-12-23 | Discharge: 2023-12-23 | Disposition: A | Discharge status: Home / Self Care | Source: Ambulatory Visit | Attending: Obstetrics & Gynecology | Admitting: Obstetrics & Gynecology

## 2023-12-23 DIAGNOSIS — N898 Other specified noninflammatory disorders of vagina: Secondary | ICD-10-CM | POA: Insufficient documentation

## 2023-12-23 DIAGNOSIS — Z113 Encounter for screening for infections with a predominantly sexual mode of transmission: Secondary | ICD-10-CM

## 2023-12-23 NOTE — Progress Notes (Signed)
   NURSE VISIT- VAGINITIS/STD  SUBJECTIVE:  Caitlyn Chen is a 28 y.o. G0P0000 GYN patientfemale here for a vaginal swab for vaginitis screening, STD screen.  She reports the following symptoms: odor for 1 week. New partner Denies abnormal vaginal bleeding, significant pelvic pain, fever, or UTI symptoms.  OBJECTIVE:  There were no vitals taken for this visit.  Appears well, in no apparent distress  ASSESSMENT: Vaginal swab for vaginitis screening  PLAN: Self-collected vaginal probe for Gonorrhea, Chlamydia, Trichomonas, Bacterial Vaginosis, Yeast sent to lab Treatment: to be determined once results are received Follow-up as needed if symptoms persist/worsen, or new symptoms develop  Nancye Grumbine  12/23/2023 3:53 PM

## 2023-12-25 ENCOUNTER — Ambulatory Visit: Payer: Self-pay

## 2023-12-25 DIAGNOSIS — A749 Chlamydial infection, unspecified: Secondary | ICD-10-CM

## 2023-12-25 LAB — CERVICOVAGINAL ANCILLARY ONLY
Bacterial Vaginitis (gardnerella): POSITIVE — AB
Candida Glabrata: NEGATIVE
Candida Vaginitis: NEGATIVE
Chlamydia: POSITIVE — AB
Comment: NEGATIVE
Comment: NEGATIVE
Comment: NEGATIVE
Comment: NEGATIVE
Comment: NEGATIVE
Comment: NORMAL
Neisseria Gonorrhea: NEGATIVE
Trichomonas: NEGATIVE

## 2023-12-25 MED ORDER — DOXYCYCLINE HYCLATE 100 MG PO TABS: 100.0000 mg | ORAL_TABLET | Freq: Two times a day (BID) | ORAL | 0 refills | Status: DC

## 2023-12-25 MED ORDER — METRONIDAZOLE 500 MG PO TABS: 500.0000 mg | ORAL_TABLET | Freq: Two times a day (BID) | ORAL | 0 refills | Status: DC

## 2023-12-26 ENCOUNTER — Other Ambulatory Visit: Payer: Self-pay | Admitting: Adult Health

## 2024-02-03 ENCOUNTER — Telehealth

## 2024-02-03 ENCOUNTER — Ambulatory Visit

## 2024-02-05 ENCOUNTER — Ambulatory Visit (INDEPENDENT_AMBULATORY_CARE_PROVIDER_SITE_OTHER)

## 2024-02-05 ENCOUNTER — Other Ambulatory Visit (HOSPITAL_COMMUNITY)
Admission: RE | Admit: 2024-02-05 | Discharge: 2024-02-05 | Disposition: A | Discharge status: Home / Self Care | Source: Ambulatory Visit | Attending: Obstetrics & Gynecology | Admitting: Obstetrics & Gynecology

## 2024-02-05 DIAGNOSIS — B9689 Other specified bacterial agents as the cause of diseases classified elsewhere: Secondary | ICD-10-CM | POA: Diagnosis not present

## 2024-02-05 DIAGNOSIS — Z8619 Personal history of other infectious and parasitic diseases: Secondary | ICD-10-CM

## 2024-02-05 DIAGNOSIS — Z113 Encounter for screening for infections with a predominantly sexual mode of transmission: Secondary | ICD-10-CM | POA: Insufficient documentation

## 2024-02-05 DIAGNOSIS — N760 Acute vaginitis: Secondary | ICD-10-CM | POA: Insufficient documentation

## 2024-02-05 NOTE — Progress Notes (Signed)
   NURSE VISIT- POC  SUBJECTIVE:  Caitlyn Chen is a 28 y.o. G0P0000 GYN patientfemale here for a vaginal swab for proof of cure after treatment for Chlamydia.  She reports the following symptoms: none. Denies abnormal vaginal bleeding, significant pelvic pain, fever, or UTI symptoms. Patient did want to go ahead and get checked for everything just in case.  OBJECTIVE:  There were no vitals taken for this visit.  Appears well, in no apparent distress  ASSESSMENT: Vaginal swab for proof of cure after treatment for Chlamydia  PLAN: Self-collected vaginal probe for Chlamydia sent to lab Treatment: to be determined once results are received Follow-up as needed if symptoms persist/worsen, or new symptoms develop  Aleck FORBES Blase  02/05/2024 12:12 PM

## 2024-02-06 LAB — CERVICOVAGINAL ANCILLARY ONLY
Bacterial Vaginitis (gardnerella): POSITIVE — AB
Candida Glabrata: NEGATIVE
Candida Vaginitis: NEGATIVE
Chlamydia: NEGATIVE
Comment: NEGATIVE
Comment: NEGATIVE
Comment: NEGATIVE
Comment: NEGATIVE
Comment: NEGATIVE
Comment: NORMAL
Neisseria Gonorrhea: NEGATIVE
Trichomonas: NEGATIVE

## 2024-02-07 ENCOUNTER — Ambulatory Visit: Payer: Self-pay | Admitting: Adult Health

## 2024-02-07 MED ORDER — METRONIDAZOLE 500 MG PO TABS: 500.0000 mg | ORAL_TABLET | Freq: Two times a day (BID) | ORAL | 0 refills | Status: DC

## 2024-06-23 ENCOUNTER — Other Ambulatory Visit (HOSPITAL_COMMUNITY)
Admission: RE | Admit: 2024-06-23 | Discharge: 2024-06-23 | Disposition: A | Discharge status: Home / Self Care | Source: Ambulatory Visit | Attending: Obstetrics & Gynecology | Admitting: Obstetrics & Gynecology

## 2024-06-23 ENCOUNTER — Ambulatory Visit (INDEPENDENT_AMBULATORY_CARE_PROVIDER_SITE_OTHER): Admitting: *Deleted

## 2024-06-23 DIAGNOSIS — N898 Other specified noninflammatory disorders of vagina: Secondary | ICD-10-CM | POA: Diagnosis not present

## 2024-06-23 DIAGNOSIS — Z113 Encounter for screening for infections with a predominantly sexual mode of transmission: Secondary | ICD-10-CM

## 2024-06-23 NOTE — Progress Notes (Signed)
   NURSE VISIT- VAGINITIS/STD screen  SUBJECTIVE:  Caitlyn Chen is a 28 y.o. G0P0000 GYN patientfemale here for a vaginal swab for vaginitis screening, STD screen.  She reports the following symptoms: vulvar itching for 1 week. Denies abnormal vaginal bleeding, significant pelvic pain, fever, or UTI symptoms. Also requesting HIV/RPR testing.  OBJECTIVE:  There were no vitals taken for this visit.  Appears well, in no apparent distress  ASSESSMENT: Vaginal swab for vaginitis screening/STD screen  PLAN: Self-collected vaginal probe for Gonorrhea, Chlamydia, Trichomonas, Bacterial Vaginosis, Yeast sent to lab Treatment: to be determined once results are received Follow-up as needed if symptoms persist/worsen, or new symptoms develop  Amadi Yoshino  06/23/2024 3:08 PM

## 2024-06-24 ENCOUNTER — Ambulatory Visit: Payer: Self-pay | Admitting: Adult Health

## 2024-06-24 LAB — HIV ANTIBODY (ROUTINE TESTING W REFLEX): HIV Screen 4th Generation wRfx: NONREACTIVE

## 2024-06-24 LAB — RPR: RPR Ser Ql: NONREACTIVE

## 2024-06-25 LAB — CERVICOVAGINAL ANCILLARY ONLY
Bacterial Vaginitis (gardnerella): POSITIVE — AB
Candida Glabrata: NEGATIVE
Candida Vaginitis: NEGATIVE
Chlamydia: NEGATIVE
Comment: NEGATIVE
Comment: NEGATIVE
Comment: NEGATIVE
Comment: NEGATIVE
Comment: NEGATIVE
Comment: NORMAL
Neisseria Gonorrhea: NEGATIVE
Trichomonas: NEGATIVE

## 2024-06-25 MED ORDER — METRONIDAZOLE 500 MG PO TABS: 500.0000 mg | ORAL_TABLET | Freq: Two times a day (BID) | ORAL | 0 refills | Status: AC

## 2024-08-20 ENCOUNTER — Ambulatory Visit: Admitting: Obstetrics & Gynecology

## 2024-10-07 ENCOUNTER — Ambulatory Visit: Admitting: Obstetrics & Gynecology
